# Patient Record
Sex: Male | Born: 1960 | Race: White | Hispanic: No | Marital: Single | Smoking: Former smoker
Health system: Southern US, Community
[De-identification: ages and names within clinical notes are randomized; demographics above are authoritative.]

## PROBLEM LIST (undated history)

## (undated) DIAGNOSIS — K219 Gastro-esophageal reflux disease without esophagitis: Secondary | ICD-10-CM

## (undated) DIAGNOSIS — E785 Hyperlipidemia, unspecified: Secondary | ICD-10-CM

## (undated) DIAGNOSIS — Z86010 Personal history of colonic polyps: Secondary | ICD-10-CM

## (undated) DIAGNOSIS — N529 Male erectile dysfunction, unspecified: Secondary | ICD-10-CM

## (undated) DIAGNOSIS — Z720 Tobacco use: Secondary | ICD-10-CM

## (undated) DIAGNOSIS — Z860101 Personal history of adenomatous and serrated colon polyps: Secondary | ICD-10-CM

## (undated) DIAGNOSIS — L0292 Furuncle, unspecified: Secondary | ICD-10-CM

## (undated) DIAGNOSIS — L0293 Carbuncle, unspecified: Secondary | ICD-10-CM

## (undated) DIAGNOSIS — E669 Obesity, unspecified: Secondary | ICD-10-CM

## (undated) HISTORY — DX: Furuncle, unspecified: L02.92

## (undated) HISTORY — DX: Male erectile dysfunction, unspecified: N52.9

## (undated) HISTORY — DX: Tobacco use: Z72.0

## (undated) HISTORY — DX: Hyperlipidemia, unspecified: E78.5

## (undated) HISTORY — PX: COLONOSCOPY: SHX174

## (undated) HISTORY — DX: Personal history of adenomatous and serrated colon polyps: Z86.0101

## (undated) HISTORY — DX: Personal history of colonic polyps: Z86.010

## (undated) HISTORY — DX: Gastro-esophageal reflux disease without esophagitis: K21.9

## (undated) HISTORY — PX: ELBOW FRACTURE SURGERY: SHX616

## (undated) HISTORY — DX: Carbuncle, unspecified: L02.93

## (undated) HISTORY — DX: Obesity, unspecified: E66.9

## (undated) HISTORY — PX: WISDOM TOOTH EXTRACTION: SHX21

---

## 2003-11-23 ENCOUNTER — Ambulatory Visit: Payer: Self-pay | Admitting: Family Medicine

## 2004-01-17 ENCOUNTER — Ambulatory Visit: Payer: Self-pay | Admitting: Family Medicine

## 2004-01-24 ENCOUNTER — Ambulatory Visit: Payer: Self-pay | Admitting: Family Medicine

## 2004-01-24 ENCOUNTER — Encounter: Admission: RE | Admit: 2004-01-24 | Discharge: 2004-01-24 | Payer: Self-pay | Admitting: Family Medicine

## 2004-01-31 ENCOUNTER — Ambulatory Visit: Payer: Self-pay | Admitting: Internal Medicine

## 2004-02-10 ENCOUNTER — Ambulatory Visit: Payer: Self-pay | Admitting: Internal Medicine

## 2004-06-30 ENCOUNTER — Ambulatory Visit: Payer: Self-pay | Admitting: Family Medicine

## 2004-07-01 ENCOUNTER — Emergency Department (HOSPITAL_COMMUNITY): Admission: EM | Admit: 2004-07-01 | Discharge: 2004-07-01 | Payer: Self-pay | Admitting: Emergency Medicine

## 2004-07-18 ENCOUNTER — Ambulatory Visit: Payer: Self-pay | Admitting: Family Medicine

## 2005-04-26 ENCOUNTER — Ambulatory Visit: Payer: Self-pay | Admitting: Family Medicine

## 2005-07-26 ENCOUNTER — Ambulatory Visit: Payer: Self-pay | Admitting: Family Medicine

## 2005-11-08 ENCOUNTER — Ambulatory Visit: Payer: Self-pay | Admitting: Family Medicine

## 2006-01-29 ENCOUNTER — Ambulatory Visit: Payer: Self-pay | Admitting: Family Medicine

## 2006-01-29 LAB — CONVERTED CEMR LAB
Cholesterol: 123 mg/dL (ref 0–200)
HDL: 26.6 mg/dL — ABNORMAL LOW (ref >39.0)
Total CHOL/HDL Ratio: 4.6
Triglycerides: 60 mg/dL (ref 0–149)

## 2006-09-25 DIAGNOSIS — L0293 Carbuncle, unspecified: Secondary | ICD-10-CM

## 2006-09-25 DIAGNOSIS — L0292 Furuncle, unspecified: Secondary | ICD-10-CM | POA: Insufficient documentation

## 2006-09-25 DIAGNOSIS — E785 Hyperlipidemia, unspecified: Secondary | ICD-10-CM | POA: Insufficient documentation

## 2006-09-25 DIAGNOSIS — Z720 Tobacco use: Secondary | ICD-10-CM | POA: Insufficient documentation

## 2006-09-25 DIAGNOSIS — E669 Obesity, unspecified: Secondary | ICD-10-CM | POA: Insufficient documentation

## 2006-09-25 DIAGNOSIS — F528 Other sexual dysfunction not due to a substance or known physiological condition: Secondary | ICD-10-CM | POA: Insufficient documentation

## 2006-10-14 DIAGNOSIS — L0231 Cutaneous abscess of buttock: Secondary | ICD-10-CM | POA: Insufficient documentation

## 2006-10-14 DIAGNOSIS — L03317 Cellulitis of buttock: Secondary | ICD-10-CM | POA: Insufficient documentation

## 2006-10-22 ENCOUNTER — Ambulatory Visit: Payer: Self-pay | Admitting: Family Medicine

## 2006-10-29 ENCOUNTER — Ambulatory Visit: Payer: Self-pay | Admitting: Family Medicine

## 2006-10-29 ENCOUNTER — Telehealth: Payer: Self-pay | Admitting: Family Medicine

## 2007-04-27 DIAGNOSIS — M26629 Arthralgia of temporomandibular joint, unspecified side: Secondary | ICD-10-CM | POA: Insufficient documentation

## 2007-04-29 ENCOUNTER — Ambulatory Visit: Payer: Self-pay | Admitting: Family Medicine

## 2007-06-17 DIAGNOSIS — L03119 Cellulitis of unspecified part of limb: Secondary | ICD-10-CM

## 2007-06-17 DIAGNOSIS — L02419 Cutaneous abscess of limb, unspecified: Secondary | ICD-10-CM | POA: Insufficient documentation

## 2007-06-18 ENCOUNTER — Ambulatory Visit: Payer: Self-pay | Admitting: Family Medicine

## 2007-06-23 ENCOUNTER — Ambulatory Visit: Payer: Self-pay | Admitting: Family Medicine

## 2007-06-23 ENCOUNTER — Encounter: Payer: Self-pay | Admitting: *Deleted

## 2007-06-26 ENCOUNTER — Ambulatory Visit: Payer: Self-pay | Admitting: Family Medicine

## 2007-07-08 ENCOUNTER — Ambulatory Visit: Payer: Self-pay | Admitting: Family Medicine

## 2007-07-08 LAB — CONVERTED CEMR LAB
ALT: 16 units/L (ref 0–53)
AST: 16 units/L (ref 0–37)
Alkaline Phosphatase: 57 units/L (ref 39–117)
Basophils Absolute: 0 10*3/uL (ref 0.0–0.1)
Basophils Relative: 0.1 % (ref 0.0–1.0)
Bilirubin, Direct: 0.1 mg/dL (ref 0.0–0.3)
CO2: 28 meq/L (ref 19–32)
Chloride: 105 meq/L (ref 96–112)
Eosinophils Absolute: 0.2 10*3/uL (ref 0.0–0.7)
GFR calc non Af Amer: 96 mL/min
HDL: 30.2 mg/dL — ABNORMAL LOW (ref >39.0)
LDL Cholesterol: 94 mg/dL (ref 0–99)
Lymphocytes Relative: 21.6 % (ref 12.0–46.0)
MCHC: 35 g/dL (ref 30.0–36.0)
MCV: 89.4 fL (ref 78.0–100.0)
Neutrophils Relative %: 68.8 % (ref 43.0–77.0)
Platelets: 247 10*3/uL (ref 150–400)
Potassium: 4.5 meq/L (ref 3.5–5.1)
Protein, U semiquant: NEGATIVE
RBC: 5.13 M/uL (ref 4.22–5.81)
Total Bilirubin: 0.7 mg/dL (ref 0.3–1.2)
Urobilinogen, UA: 0.2
VLDL: 15 mg/dL (ref 0–40)
WBC Urine, dipstick: NEGATIVE
WBC: 7.9 10*3/uL (ref 4.5–10.5)
pH: 5

## 2007-07-15 ENCOUNTER — Ambulatory Visit: Payer: Self-pay | Admitting: Family Medicine

## 2007-10-04 ENCOUNTER — Ambulatory Visit: Payer: Self-pay | Admitting: Family Medicine

## 2008-03-21 ENCOUNTER — Encounter: Payer: Self-pay | Admitting: Emergency Medicine

## 2009-01-12 ENCOUNTER — Encounter (INDEPENDENT_AMBULATORY_CARE_PROVIDER_SITE_OTHER): Payer: Self-pay | Admitting: *Deleted

## 2009-03-30 ENCOUNTER — Ambulatory Visit: Payer: Self-pay | Admitting: Family Medicine

## 2009-03-30 LAB — CONVERTED CEMR LAB
AST: 19 units/L (ref 0–37)
Alkaline Phosphatase: 53 units/L (ref 39–117)
Basophils Absolute: 0 10*3/uL (ref 0.0–0.1)
Basophils Relative: 0.6 % (ref 0.0–3.0)
Bilirubin, Direct: 0 mg/dL (ref 0.0–0.3)
Blood in Urine, dipstick: NEGATIVE
CO2: 29 meq/L (ref 19–32)
Calcium: 9.1 mg/dL (ref 8.4–10.5)
Creatinine, Ser: 0.9 mg/dL (ref 0.4–1.5)
Eosinophils Absolute: 0.2 10*3/uL (ref 0.0–0.7)
GFR calc non Af Amer: 95.41 mL/min (ref >60)
HDL: 36.3 mg/dL — ABNORMAL LOW (ref >39.00)
Ketones, urine, test strip: NEGATIVE
LDL Cholesterol: 99 mg/dL (ref 0–99)
Lymphocytes Relative: 22.5 % (ref 12.0–46.0)
MCHC: 32.7 g/dL (ref 30.0–36.0)
Monocytes Relative: 7.5 % (ref 3.0–12.0)
Neutrophils Relative %: 67.5 % (ref 43.0–77.0)
Nitrite: NEGATIVE
Protein, U semiquant: NEGATIVE
RBC: 4.98 M/uL (ref 4.22–5.81)
Sodium: 141 meq/L (ref 135–145)
Specific Gravity, Urine: 1.015
Total CHOL/HDL Ratio: 4
Total Protein: 6.6 g/dL (ref 6.0–8.3)
Triglycerides: 85 mg/dL (ref 0.0–149.0)
Urobilinogen, UA: 0.2
VLDL: 17 mg/dL (ref 0.0–40.0)
WBC Urine, dipstick: NEGATIVE

## 2009-04-19 ENCOUNTER — Ambulatory Visit: Payer: Self-pay | Admitting: Family Medicine

## 2009-06-20 ENCOUNTER — Ambulatory Visit: Payer: Self-pay | Admitting: Family Medicine

## 2009-06-22 ENCOUNTER — Ambulatory Visit: Payer: Self-pay | Admitting: Family Medicine

## 2009-07-25 ENCOUNTER — Ambulatory Visit: Payer: Self-pay | Admitting: Family Medicine

## 2009-07-25 DIAGNOSIS — M216X9 Other acquired deformities of unspecified foot: Secondary | ICD-10-CM | POA: Insufficient documentation

## 2009-09-08 ENCOUNTER — Encounter: Payer: Self-pay | Admitting: Family Medicine

## 2009-09-12 ENCOUNTER — Telehealth: Payer: Self-pay | Admitting: Internal Medicine

## 2009-09-21 ENCOUNTER — Encounter (INDEPENDENT_AMBULATORY_CARE_PROVIDER_SITE_OTHER): Payer: Self-pay | Admitting: *Deleted

## 2009-11-21 ENCOUNTER — Ambulatory Visit: Payer: Self-pay | Admitting: Family Medicine

## 2010-01-31 NOTE — Assessment & Plan Note (Signed)
Summary: flu shot/cjr  Nurse Visit   Allergies: No Known Drug Allergies  Orders Added: 1)  Admin 1st Vaccine [90471] 2)  Flu Vaccine 3yrs + [90658] Flu Vaccine Consent Questions     Do you have a history of severe allergic reactions to this vaccine? no    Any prior history of allergic reactions to egg and/or gelatin? no    Do you have a sensitivity to the preservative Thimersol? no    Do you have a past history of Guillan-Barre Syndrome? no    Do you currently have an acute febrile illness? no    Have you ever had a severe reaction to latex? no    Vaccine information given and explained to patient? yes    Are you currently pregnant? no    Lot Number:[id number]   Exp Date:07/01/2010   Site Given  Left Deltoid IM .lbflu 

## 2010-01-31 NOTE — Assessment & Plan Note (Signed)
Summary: CPX // RS   Vital Signs:  Patient profile:   50 year old male Height:      72.5 inches Weight:      237 pounds Temp:     98.1 degrees F oral BP sitting:   120 / 80  (left arm) Cuff size:   regular  Vitals Entered By: Kern Reap CMA Duncan Dull) (April 19, 2009 3:01 PM) CC: cpx   CC:  cpx.  History of Present Illness: Marc Brown is a 50 year old, divorced male, smoker, one pack per day and comes in today for general physical examination  We have discussed very strategies in the past to try to get him off nicotine.  His major concern is the history of depression with the medication.  I explained to him that this was extremely rare and that by starting out with a half a tablet daily for a prolonged period of time.  This may work.  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  He also drinks 40 ounces of caffeinated coffee daily.  His alcohol consumption is modest only twice per week.  He does walk his dog on a daily basis.    His routine eye care and dental care.  Tetanus 2007 seasonal flu 2010.  he's had two colonoscopies.  He has had colon polyps and is encouraged to go back every 5 years for follow-up colonoscopy  Allergies: No Known Drug Allergies  Past History:  Past medical, surgical, family and social histories (including risk factors) reviewed, and no changes noted (except as noted below).  Past Medical History: Reviewed history from 09/25/2006 and no changes required. Hyperlipidemia obese tobacco abuse erectile dysfunction recurrent boils  Past Surgical History: Reviewed history from 09/25/2006 and no changes required. elbow fx L  Family History: Reviewed history from 04/29/2007 and no changes required. father died at 77 of an MI.  He was underlying obesity smoker and schizophrenic mother early 13s has deafness secondary to whooping cough, polycystic kidneys two half brothers, one has MS, the other has  hyperlipidemia one sister, a half-sister, who had polio at age 41, and, is wheelchair confined  Social History: Reviewed history from 04/29/2007 and no changes required. Occupation:interpreter for the deaf Divorced Current Smoker Alcohol use-no Drug use-no Regular exercise-no  Review of Systems      See HPI  Physical Exam  General:  Well-developed,well-nourished,in no acute distress; alert,appropriate and cooperative throughout examination Head:  Normocephalic and atraumatic without obvious abnormalities. No apparent alopecia or balding. Eyes:  No corneal or conjunctival inflammation noted. EOMI. Perrla. Funduscopic exam benign, without hemorrhages, exudates or papilledema. Vision grossly normal. Ears:  External ear exam shows no significant lesions or deformities.  Otoscopic examination reveals clear canals, tympanic membranes are intact bilaterally without bulging, retraction, inflammation or discharge. Hearing is grossly normal bilaterally. Nose:  External nasal examination shows no deformity or inflammation. Nasal mucosa are pink and moist without lesions or exudates. Mouth:  Oral mucosa and oropharynx without lesions or exudates.  Teeth in good repair. Neck:  No deformities, masses, or tenderness noted. Chest Wall:  No deformities, masses, tenderness or gynecomastia noted. Breasts:  No masses or gynecomastia noted Lungs:  Normal respiratory effort, chest expands symmetrically. Lungs are clear to auscultation, no crackles or wheezes. Heart:  Normal rate and regular rhythm. S1 and S2 normal without gallop, murmur, click, rub or other extra sounds. Abdomen:  Bowel sounds positive,abdomen soft and non-tender without masses,  organomegaly or hernias noted. Rectal:  No external abnormalities noted. Normal sphincter tone. No rectal masses or tenderness. Genitalia:  Testes bilaterally descended without nodularity, tenderness or masses. No scrotal masses or lesions. No penis lesions or  urethral discharge. Prostate:  Prostate gland firm and smooth, no enlargement, nodularity, tenderness, mass, asymmetry or induration. Msk:  No deformity or scoliosis noted of thoracic or lumbar spine.   Pulses:  R and L carotid,radial,femoral,dorsalis pedis and posterior tibial pulses are full and equal bilaterally Extremities:  No clubbing, cyanosis, edema, or deformity noted with normal full range of motion of all joints.   Neurologic:  No cranial nerve deficits noted. Station and gait are normal. Plantar reflexes are down-going bilaterally. DTRs are symmetrical throughout. Sensory, motor and coordinative functions appear intact. Skin:  Intact without suspicious lesions or rashes Cervical Nodes:  No lymphadenopathy noted Axillary Nodes:  No palpable lymphadenopathy Inguinal Nodes:  No significant adenopathy Psych:  Cognition and judgment appear intact. Alert and cooperative with normal attention span and concentration. No apparent delusions, illusions, hallucinations   Impression & Recommendations:  Problem # 1:  PHYSICAL EXAMINATION (ICD-V70.0) Assessment Unchanged  Orders: Prescription Created Electronically [phone]) Tobacco use cessation intermediate 3-10 minutes (99406) EKG w/ Interpretation (93000)  Problem # 2:  TEMPOROMANDIBULAR JOINT PAIN (ICD-524.62) Assessment: Improved  Orders: Prescription Created Electronically [phone]) Tobacco use cessation intermediate 3-10 minutes (09811)  Problem # 3:  TOBACCO ABUSE (ICD-305.1) Assessment: Unchanged  His updated medication list for this problem includes:    Chantix Continuing Month Pak 1 Mg Tabs (Varenicline tartrate) ..... Uad  Orders: Prescription Created Electronically [phone]) Tobacco use cessation intermediate 3-10 minutes (29562)  Complete Medication List: 1)  Aspirin 81 Mg Tbec (Aspirin) .... Once daily 2)  Amitriptyline Hcl 25 Mg Tabs (Amitriptyline hcl) .Marland Kitchen.. 1 tab @ bedtime 3)  Chantix Continuing Month Pak 1 Mg Tabs  (Varenicline tartrate) .... Uad  Patient Instructions: 1)  tried the chantix program as we outlined .  If you have any problems, feel free to call me I'll be happy to try to work with you 2)  Please schedule a follow-up appointment in 1 year. 3)  It is important that you exercise regularly at least 20 minutes 5 times a week. If you develop chest pain, have severe difficulty breathing, or feel very tired , stop exercising immediately and seek medical attention. 4)  Schedule a colonoscopy/sigmoidoscopy to help detect colon cancer. 5)  Take an Aspirin every day. Prescriptions: CHANTIX CONTINUING MONTH PAK 1 MG TABS (VARENICLINE TARTRATE) UAD  #1 x 3   Entered and Authorized by:   Roderick Pee MD   Signed by:   Roderick Pee MD on 04/19/2009   Method used:   Print then Give to Patient   RxID:   [id number] AMITRIPTYLINE HCL 25 MG  TABS (AMITRIPTYLINE HCL) 1 tab @ bedtime  #100 Tablet x 3   Entered and Authorized by:   Roderick Pee MD   Signed by:   Roderick Pee MD on 04/19/2009   Method used:   [facility] to        [facility]  [address] (retail)       [address]       [place], [place]  [address]       Ph: [phone] or [phone]       Fax: [phone]   RxID:   [id number]

## 2010-01-31 NOTE — Consult Note (Signed)
Summary: [facility]  [facility]   Imported By: Maryln Gottron 09/15/2009 15:09:42  _____________________________________________________________________  External Attachment:    Type:   Image     Comment:   External Document

## 2010-01-31 NOTE — Assessment & Plan Note (Signed)
Summary: F/U ON LEG // RS   History of Present Illness: Marc Brown is a 50 year old male, who comes back today for follow-up of a cellulitis of his right lateral lower extremity.  We saw him two days ago with cellulitis.  No history of trauma cuts et Karie Soda.  This is the third episode he sat in the same extremity.  Review of systems otherwise negative.  We start him on Augmentin one b.i.d. he states is markedly improved.  The redness is starting to diminish.  No side effects from medication.  Warned about Clostridium difficile colitis for atb   Allergies: No Known Drug Allergies  Past History:  Past medical, surgical, family and social histories (including risk factors) reviewed, and no changes noted (except as noted below).  Past Medical History: Reviewed history from 09/25/2006 and no changes required. Hyperlipidemia obese tobacco abuse erectile dysfunction recurrent boils  Past Surgical History: Reviewed history from 09/25/2006 and no changes required. elbow fx L  Family History: Reviewed history from 04/29/2007 and no changes required. father died at 74 of an MI.  He was underlying obesity smoker and schizophrenic mother early 64s has deafness secondary to whooping cough, polycystic kidneys two half brothers, one has MS, the other has hyperlipidemia one sister, a half-sister, who had polio at age 35, and, is wheelchair confined  Social History: Reviewed history from 04/19/2009 and no changes required. Occupation:interpreter for the deaf Divorced Current Smoker Alcohol use-no Drug use-no Regular exercise-no  Review of Systems      See HPI  Physical Exam  General:  Well-developed,well-nourished,in no acute distress; alert,appropriate and cooperative throughout examination Skin:  resolving cellulitis.   Impression & Recommendations:  Problem # 1:  CELLULITIS, LEG, RIGHT (ICD-682.6) Assessment Improved  His updated medication list for this problem includes:  Augmentin 875-125 Mg Tabs (Amoxicillin-pot clavulanate) .Marland Kitchen... Take 1 tablet by mouth two times a day  Complete Medication List: 1)  Aspirin 81 Mg Tbec (Aspirin) .... Once daily 2)  Amitriptyline Hcl 25 Mg Tabs (Amitriptyline hcl) .Marland Kitchen.. 1 tab @ bedtime 3)  Chantix Continuing Month Pak 1 Mg Tabs (Varenicline tartrate) .... Uad 4)  Augmentin 875-125 Mg Tabs (Amoxicillin-pot clavulanate) .... Take 1 tablet by mouth two times a day  Patient Instructions: 1)  continue the medication as outlined.  Return p.r.n.

## 2010-01-31 NOTE — Letter (Signed)
Summary: Colonoscopy Letter  [facility]  [address] [place], [place] [address]   Phone: [phone]  Fax: [phone]      January 12, 2009 MRN: [id number]   TYRIC RODEHEAVER [address] [place], [place]  [address]   Dear Mr. Boyington,   According to your medical record, it is time for you to schedule a Colonoscopy. The American Cancer Society recommends this procedure as a method to detect early colon cancer. Patients with a family history of colon cancer, or a personal history of colon polyps or inflammatory bowel disease are at increased risk.  This letter has beeen generated based on the recommendations made at the time of your procedure. If you feel that in your particular situation this may no longer apply, please contact our office.  Please call our office at [phone] to schedule this appointment or to update your records at your earliest convenience.  Thank you for cooperating with [place] to provide you with the very best care possible.   Sincerely,  Iva Boop, M.D.  [facility] [phone]

## 2010-01-31 NOTE — Progress Notes (Signed)
Summary: Schedule Colonoscopy  Phone Note Outgoing Call Call back at [facility] Phone [phone]   Call placed by: Harlow Mares CMA Duncan Dull),  September 12, 2009 11:51 AM Call placed to: Patient Summary of Call: No Answer, patient is due for a colonoscopy Initial call taken by: Harlow Mares CMA Duncan Dull),  September 12, 2009 11:56 AM  Follow-up for Phone Call        COLON 10/11 Follow-up by: Harlow Mares CMA (AAMA),  September 21, 2009 10:40 AM

## 2010-01-31 NOTE — Assessment & Plan Note (Signed)
Summary: cellulitis//ccm   Vital Signs:  Patient profile:   50 year old male Weight:      239 pounds Temp:     98.2 degrees F oral BP sitting:   110 / 80  (left arm) Cuff size:   regular  Vitals Entered By: Kern Reap CMA Duncan Dull) (June 20, 2009 9:26 AM) CC: cellutitis right leg   CC:  cellutitis right leg.  History of Present Illness: Doreatha Martin is a 50 year old male, who comes in today for evaluation of the third episode of cellulitis of his right lower extremity.  He had the same problem in 2009.  He did not respond to Keflex.  We had to give him a gram of Rocephin IM, followed by Cipro.  No history of trauma.  It started yesterday with redness and swelling of his right lateral lower extremity.  Allergies: No Known Drug Allergies  Physical Exam  General:  Well-developed,well-nourished,in no acute distress; alert,appropriate and cooperative throughout examination Msk:  this redness and swelling of the right lateral lower extremity.  Approximately 4 x 8 inches in diameter   Impression & Recommendations:  Problem # 1:  CELLULITIS, LEG, RIGHT (ICD-682.6) Assessment Deteriorated  His updated medication list for this problem includes:    Augmentin 875-125 Mg Tabs (Amoxicillin-pot clavulanate) .Marland Kitchen... Take 1 tablet by mouth two times a day  Orders: Prescription Created Electronically [phone])  Complete Medication List: 1)  Aspirin 81 Mg Tbec (Aspirin) .... Once daily 2)  Amitriptyline Hcl 25 Mg Tabs (Amitriptyline hcl) .Marland Kitchen.. 1 tab @ bedtime 3)  Chantix Continuing Month Pak 1 Mg Tabs (Varenicline tartrate) .... Uad 4)  Augmentin 875-125 Mg Tabs (Amoxicillin-pot clavulanate) .... Take 1 tablet by mouth two times a day  Patient Instructions: 1)  begin Augmentin one twice daily.  Elevation, heating pad etc.  Return Wednesday morning at 815, sooner if any problems Prescriptions: AUGMENTIN 875-125 MG TABS (AMOXICILLIN-POT CLAVULANATE) Take 1 tablet by mouth two times a day  #20 x 1   Entered and Authorized by:   Roderick Pee MD   Signed by:   Roderick Pee MD on 06/20/2009   Method used:   Electronically to        [facility] ([facility]ail)       [address]       [place], [place]  [address]       Ph: [phone]       Fax: [phone]   RxID:   [id number]

## 2010-01-31 NOTE — Assessment & Plan Note (Signed)
Summary: fu on meds/njr   Vital Signs:  Patient profile:   50 year old male Height:      74 inches Weight:      234 pounds BMI:     30.15 Temp:     98.1 degrees F oral BP sitting:   142 / 98  (left arm) Cuff size:   regular  Vitals Entered By: Kern Reap CMA Duncan Dull) (March 30, 2009 9:27 AM) CC: follow-up visit   CC:  follow-up visit.  History of Present Illness: Marc Brown is a 50 year old male, nonsmoker, who comes in today for reevaluation of TMJ syndrome.  We saw him over a year ago for his physical exam.  At that time.  He had TMJ syndrome.  That was April 2009.  We put him on Elavil 25 mg nightly a soft diet and Vicodin p.r.n. for severe pain.  He never filled the Vicodin.  He did take the Motrin and the Elavil.  With the Elavil.  His pain is gone.  Allergies: No Known Drug Allergies  Past History:  Past medical, surgical, family and social histories (including risk factors) reviewed, and no changes noted (except as noted below).  Past Medical History: Reviewed history from 09/25/2006 and no changes required. Hyperlipidemia obese tobacco abuse erectile dysfunction recurrent boils  Past Surgical History: Reviewed history from 09/25/2006 and no changes required. elbow fx L  Family History: Reviewed history from 04/29/2007 and no changes required. father died at 44 of an MI.  He was underlying obesity smoker and schizophrenic mother early 46s has deafness secondary to whooping cough, polycystic kidneys two half brothers, one has MS, the other has hyperlipidemia one sister, a half-sister, who had polio at age 23, and, is wheelchair confined  Social History: Reviewed history from 04/29/2007 and no changes required. Occupation: Divorced Current Smoker Alcohol use-no Drug use-no Regular exercise-no  Review of Systems      See HPI  Physical Exam  General:  Well-developed,well-nourished,in no acute distress; alert,appropriate and cooperative throughout  examination   Impression & Recommendations:  Problem # 1:  TEMPOROMANDIBULAR JOINT PAIN (ICD-524.62) Assessment Improved  Complete Medication List: 1)  Aspirin 81 Mg Tbec (Aspirin) .... Once daily 2)  Amitriptyline Hcl 25 Mg Tabs (Amitriptyline hcl) .Marland Kitchen.. 1 tab @ bedtime  Other Orders: Venipuncture (89381) Prescription Created Electronically [phone]) UA Dipstick w/o Micro (automated)  (81003) TLB-Lipid Panel (80061-LIPID) TLB-BMP (Basic Metabolic Panel-BMET) (80048-METABOL) TLB-CBC Platelet - w/Differential (85025-CBCD) TLB-Hepatic/Liver Function Pnl (80076-HEPATIC) TLB-TSH (Thyroid Stimulating Hormone) (84443-TSH) TLB-PSA (Prostate Specific Antigen) (84153-PSA)  Patient Instructions: 1)  continue the Elavil 25 mg nightly 2)  We will do all your physical lab work today. 3)  Set up a 30 minute appointment sometime in the next two to 4 weeks for your annual exam Prescriptions: AMITRIPTYLINE HCL 25 MG  TABS (AMITRIPTYLINE HCL) 1 tab @ bedtime  #100 Tablet x 3   Entered and Authorized by:   Roderick Pee MD   Signed by:   Roderick Pee MD on 03/30/2009   Method used:   Electronically to        [facility])       [address]       [place], [place]  [address]       Ph: [phone]       Fax: [phone]   RxID:   [id number]   Laboratory Results   Urine Tests  Date/Time Recieved: March 30, 2009  10:40 AM  Date/Time Reported: March 30, 2009 10:40 AM    Routine Urinalysis   Color: yellow Appearance: Clear Glucose: negative   (Normal Range: Negative) Bilirubin: negative   (Normal Range: Negative) Ketone: negative   (Normal Range: Negative) Spec. Gravity: 1.015   (Normal Range: 1.003-1.035) Blood: negative   (Normal Range: Negative) pH: 7.5   (Normal Range: 5.0-8.0) Protein: negative   (Normal Range: Negative) Urobilinogen: 0.2   (Normal Range: 0-1) Nitrite: negative   (Normal Range: Negative) Leukocyte Esterace:  negative   (Normal Range: Negative)    Comments: Wynona Canes, CMA  March 30, 2009 10:40 AM

## 2010-01-31 NOTE — Assessment & Plan Note (Signed)
Summary: ?pinched nerve in knee/can not flex foot upward/slackness in ...   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 No   History of Present Illness: Patient in today for evaluation of some neurologic symptoms in his RLE. 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 Sat to Weds, he was on his knees for 4-5 hours daily with pads on pounding in bricks. Weds night he went to the movies and had trouble sitting comfortably but denies ever having any pain. Thursday am he woke up with some numbness from knee down tibial plateau into toes 3,4,5 and foot drop. He noticed it most when he was taking his daily 4 mile walk with his dog. His foot just would not lift, since then he has compensated by walking differently but he still cannot flex or extend his foot normally. He has been taking 600mg  of Ibuprofen every 4 hours trying to improve his symptoms but has only had some minimal improvement. He had a mild sense of numbness along his lateral right thigh and that is gone. The numbness now starts at mid anterior tibial plateau as opposed to at knee, still includes 3 toes. He denies any back pain or incontinence. Has a long history of recurrent mild CTS and has a little numbness in his 4th and 5th right hand fingers now but finds this tolerable. No recent illness, f,c, CP. Did have one fall when he was getting  dressed the other day and says he just bruised his tailbone neurologic concerns not worse since this episode  Current Medications (verified): 1)  Aspirin 81 Mg  Tbec (Aspirin) .... Once Daily 2)  Amitriptyline Hcl 25 Mg  Tabs (Amitriptyline Hcl) .Marland Kitchen.. 1 Tab @ Bedtime 3)  Chantix Continuing Month Pak 1 Mg Tabs (Varenicline Tartrate) .... Uad  Allergies (verified): No Known Drug Allergies  Past History:  Past medical history reviewed for relevance to current acute and chronic problems. Social history (including risk factors) reviewed for relevance to current acute and chronic problems.  Past Medical History: Reviewed history from 09/25/2006 and no changes required. Hyperlipidemia obese tobacco abuse erectile dysfunction recurrent boils  Social History: Reviewed history from 04/19/2009 and no changes required. Occupation:interpreter for the deaf Divorced Current Smoker Alcohol use-no Drug use-no Regular exercise-no  Review of Systems      See HPI  Physical Exam  General:  Well-developed,well-nourished,in no acute distress; alert,appropriate and cooperative throughout examination Head:  Normocephalic and atraumatic without obvious abnormalities. No apparent alopecia or balding. Mouth:  Oral mucosa and oropharynx without lesions or exudates.  Teeth in good repair. Neck:  No deformities, masses, or tenderness noted. Lungs:  Normal respiratory effort, chest expands  symmetrically. Lungs are clear to auscultation, no crackles or wheezes. Heart:  Normal rate and regular rhythm. S1 and S2 normal without gallop, murmur, click, rub or other extra sounds. Abdomen:  Bowel sounds positive,abdomen soft and non-tender without masses, organomegaly or hernias noted. Msk:  No deformity or scoliosis noted of thoracic or lumbar spine.   Pulses:  R and Ldorsalis pedis and posterior tibial pulses are full and equal bilaterally Extremities:  No clubbing, cyanosis, edema, or deformity noted. Unable  to dorsiflex or plantarflex at ankle in right foot. left foot from. Can invert slightly but eversion is decreased in right foot as well. Able to move all toes at will. DTRs normal and symmetric patellar    Impression & Recommendations:  Problem # 1:  FOOT DROP, RIGHT (ICD-736.79)  Orders: Neurology Referral (Neuro): due to severity of symptoms will refer now in case symptoms do not improve. Started on Prednisone 40mg  once daily x 5 days. Stop Ibuprofen and then when Prednisone is done start Naproxen bid  Problem # 2:  TOBACCO ABUSE (ICD-305.1)  His updated medication list for this problem includes:    Chantix Continuing Month Pak 1 Mg Tabs (Varenicline tartrate) ..... Uad Encouraged to try smoking cessation again as it will help healing process.  Complete Medication List: 1)  Aspirin 81 Mg Tbec (Aspirin) .... Once daily 2)  Amitriptyline Hcl 25 Mg Tabs (Amitriptyline hcl) .Marland Kitchen.. 1 tab @ bedtime 3)  Chantix Continuing Month Pak 1 Mg Tabs (Varenicline tartrate) .... Uad 4)  Prednisone 20 Mg Tabs (Prednisone) .... 2 tabs by mouth once daily x 5d  Patient Instructions: 1)  Apply ice, Aspercreme to right carpal tunnel two times a day if symptoms persist.  2)  Stop Ibuprofen, use Prednisone for 5 days then try Naproxen/Aleve as needed for pain with food Prescriptions: PREDNISONE 20 MG TABS (PREDNISONE) 2 tabs by mouth once daily x 5d  #10 x 0   Entered and Authorized by:   Danise Edge MD   Signed by:   Danise Edge MD on 07/25/2009   Method used:   Electronically to        [facility]       [address]       [place], [place]  [address]       Ph: [phone]       Fax: [phone]   RxID:   [id number]

## 2010-01-31 NOTE — Letter (Signed)
Summary: Pre Visit Letter Revised  [facility]  [address] [place], [place] [address]   Phone: [phone]  Fax: [phone]        09/21/2009 MRN: [id number] Marc Brown [address] [place], [place]  [address]                                Procedure Date:  10/11/2009  Welcome to the Gastroenterology Division at [facility].    You are scheduled to see a nurse for your pre-procedure visit on 10/07/2009 at 2:00pm on the 3rd floor at [facility], [address]  We ask that you try to arrive at our office 15 minutes prior to your appointment time to allow for check-in.  Please take a minute to review the attached form.  If you answer "Yes" to one or more of the questions on the first page, we ask that you call the person listed at your earliest opportunity.  If you answer "No" to all of the questions, please complete the rest of the form and bring it to your appointment.    Your nurse visit will consist of discussing your medical and surgical history, your immediate family medical history, and your medications.   If you are unable to list all of your medications on the form, please bring the medication bottles to your appointment and we will list them.  We will need to be aware of both prescribed and over the counter drugs.  We will need to know exact dosage information as well.    Please be prepared to read and sign documents such as consent forms, a financial agreement, and acknowledgement forms.  If necessary, and with your consent, a friend or relative is welcome to sit-in on the nurse visit with you.  Please bring your insurance card so that we may make a copy of it.  If your insurance requires a referral to see a specialist, please bring your referral form from your primary care physician.  No co-pay is required for this nurse visit.     If you cannot keep your appointment, please call [phone] to cancel or reschedule prior to your appointment  date.  This allows [place] the opportunity to schedule an appointment for another patient in need of care.    Thank you for choosing [facility] for your medical needs.  We appreciate the opportunity to care for you.  Please visit [place] at our website  to learn more about our practice.  Sincerely, The Gastroenterology Division

## 2010-04-13 LAB — MAGNESIUM: Magnesium: 2.3 mg/dL (ref 1.5–2.5)

## 2010-04-13 LAB — URINALYSIS, ROUTINE W REFLEX MICROSCOPIC
Hgb urine dipstick: NEGATIVE
Protein, ur: NEGATIVE mg/dL
Urobilinogen, UA: 0.2 mg/dL (ref 0.0–1.0)

## 2010-04-13 LAB — BASIC METABOLIC PANEL
BUN: 9 mg/dL (ref 6–23)
Calcium: 8.8 mg/dL (ref 8.4–10.5)
Creatinine, Ser: 1.1 mg/dL (ref 0.4–1.5)
GFR calc non Af Amer: 51 mL/min — ABNORMAL LOW (ref >60)
Glucose, Bld: 112 mg/dL — ABNORMAL HIGH (ref 70–99)

## 2010-04-13 LAB — DIFFERENTIAL
Basophils Absolute: 0 10*3/uL (ref 0.0–0.1)
Eosinophils Relative: 2 % (ref 0–5)
Lymphocytes Relative: 12 % (ref 12–46)
Neutro Abs: 11.7 10*3/uL — ABNORMAL HIGH (ref 1.7–7.7)
Neutrophils Relative %: 83 % — ABNORMAL HIGH (ref 43–77)

## 2010-04-13 LAB — RAPID URINE DRUG SCREEN, HOSP PERFORMED
Amphetamines: POSITIVE — AB
Barbiturates: NOT DETECTED
Benzodiazepines: POSITIVE — AB
Opiates: NOT DETECTED

## 2010-04-13 LAB — CBC
Platelets: 200 10*3/uL (ref 150–400)
RDW: 13.1 % (ref 11.5–15.5)

## 2010-05-11 ENCOUNTER — Other Ambulatory Visit: Payer: Self-pay | Admitting: Family Medicine

## 2010-08-26 ENCOUNTER — Other Ambulatory Visit: Payer: Self-pay | Admitting: Family Medicine

## 2010-10-17 ENCOUNTER — Other Ambulatory Visit: Payer: Self-pay | Admitting: Family Medicine

## 2010-12-12 ENCOUNTER — Ambulatory Visit (INDEPENDENT_AMBULATORY_CARE_PROVIDER_SITE_OTHER): Payer: BC Managed Care – PPO | Admitting: Family Medicine

## 2010-12-12 ENCOUNTER — Encounter: Payer: Self-pay | Admitting: Family Medicine

## 2010-12-12 DIAGNOSIS — E669 Obesity, unspecified: Secondary | ICD-10-CM

## 2010-12-12 DIAGNOSIS — F17200 Nicotine dependence, unspecified, uncomplicated: Secondary | ICD-10-CM

## 2010-12-12 DIAGNOSIS — E785 Hyperlipidemia, unspecified: Secondary | ICD-10-CM

## 2010-12-12 DIAGNOSIS — G4700 Insomnia, unspecified: Secondary | ICD-10-CM

## 2010-12-12 LAB — POCT URINALYSIS DIPSTICK
Blood, UA: NEGATIVE
Protein, UA: NEGATIVE
Spec Grav, UA: 1.025
Urobilinogen, UA: 0.2

## 2010-12-12 MED ORDER — AMITRIPTYLINE HCL 25 MG PO TABS: 25.0000 mg | ORAL_TABLET | Freq: Every day | ORAL | Status: DC

## 2010-12-12 MED ORDER — VARENICLINE TARTRATE 1 MG PO TABS: 1.0000 mg | ORAL_TABLET | Freq: Two times a day (BID) | ORAL | Status: DC

## 2010-12-12 NOTE — Progress Notes (Signed)
  Subjective:    Patient ID: Marc Brown, male    DOB: 1960/03/26, 50 y.o.   MRN: [id number]  HPI Marc Brown is a 50 year old male, single, nonsmoker, who comes in today for annual physical examination because of a history of sleep dysfunction, tobacco abuse, weight gain, question hypertension.  We put him on a smoking cessation program with chantix however, he did never got the prescription filled and continues to smoke a pack and half of cigarettes a day.  He's been going to the dentist and they have been monitoring his blood pressure because of slightly elevated BP here 130/90.  He's noticed some weight gain.  He septa 267 pounds.  He wonders if he might be diabetic.  Tetanus booster 2007 seasonal flu shot 2012   Review of Systems  Constitutional: Positive for unexpected weight change.       Objective:   Physical Exam  Constitutional: He is oriented to person, place, and time. He appears well-developed and well-nourished.  HENT:  Head: Normocephalic and atraumatic.  Right Ear: External ear normal.  Left Ear: External ear normal.  Nose: Nose normal.  Mouth/Throat: Oropharynx is clear and moist.  Eyes: Conjunctivae and EOM are normal. Pupils are equal, round, and reactive to light.  Neck: Normal range of motion. Neck supple. No JVD present. No tracheal deviation present. No thyromegaly present.  Cardiovascular: Normal rate, regular rhythm, normal heart sounds and intact distal pulses.  Exam reveals no gallop and no friction rub.   No murmur heard. Pulmonary/Chest: Effort normal and breath sounds normal. No stridor. No respiratory distress. He has no wheezes. He has no rales. He exhibits no tenderness.  Abdominal: Soft. Bowel sounds are normal. He exhibits no distension and no mass. There is no tenderness. There is no rebound and no guarding.  Genitourinary: Rectum normal, prostate normal and penis normal. Guaiac negative stool. No penile tenderness.  Musculoskeletal: Normal range of  motion. He exhibits no edema and no tenderness.  Lymphadenopathy:    He has no cervical adenopathy.  Neurological: He is alert and oriented to person, place, and time. He has normal reflexes. No cranial nerve deficit. He exhibits normal muscle tone.  Skin: Skin is warm and dry. No rash noted. No erythema. No pallor.       Total body skin exam normal.  He states his mother had a melanoma.  He also sees his dermatologist yearly for a skin check  Psychiatric: He has a normal mood and affect. His behavior is normal. Judgment and thought content normal.          Assessment & Plan:  Healthy male.  Tobacco abuse, start a smoking cessation program.  Follow-up in 6 weeks.  Elevated blood pressure check.  BP q.a.m. Follow-up in 6 weeks with the data and the device.  Sleep dysfunction.  Continue Elavil 25 mg nightly

## 2010-12-12 NOTE — Patient Instructions (Signed)
Check a blood pressure every morning, and return in 6 weeks with the data and the device.  Remember to take an aspirin tablet before you fly  Begin the chantix one half tablet daily in the morning.  Begin tapering cigarettes by 5 per week.  Also, follow up with this problem in 6 weeks.  Four your overall health to begin a walking program 30 minutes daily to help reduce her weight.  I will call you in a couple days with your lab work.  In [place] and Say Hello to the [facility]  for Me

## 2010-12-13 LAB — LIPID PANEL
Cholesterol: 195 mg/dL (ref 0–200)
LDL Cholesterol: 124 mg/dL — ABNORMAL HIGH (ref 0–99)

## 2010-12-13 LAB — CBC WITH DIFFERENTIAL/PLATELET
Basophils Relative: 0.5 % (ref 0.0–3.0)
Eosinophils Absolute: 0.2 10*3/uL (ref 0.0–0.7)
HCT: 45.6 % (ref 39.0–52.0)
Hemoglobin: 15.6 g/dL (ref 13.0–17.0)
Lymphs Abs: 2.6 10*3/uL (ref 0.7–4.0)
MCHC: 34.2 g/dL (ref 30.0–36.0)
MCV: 90.4 fl (ref 78.0–100.0)
Monocytes Absolute: 0.7 10*3/uL (ref 0.1–1.0)
Neutro Abs: 4.7 10*3/uL (ref 1.4–7.7)
Neutrophils Relative %: 56.9 % (ref 43.0–77.0)
RBC: 5.04 Mil/uL (ref 4.22–5.81)

## 2010-12-13 LAB — BASIC METABOLIC PANEL
CO2: 27 mEq/L (ref 19–32)
Calcium: 8.8 mg/dL (ref 8.4–10.5)
Chloride: 105 mEq/L (ref 96–112)
Creatinine, Ser: 0.8 mg/dL (ref 0.4–1.5)
Glucose, Bld: 87 mg/dL (ref 70–99)

## 2010-12-13 LAB — HEPATIC FUNCTION PANEL
Alkaline Phosphatase: 60 U/L (ref 39–117)
Bilirubin, Direct: 0.1 mg/dL (ref 0.0–0.3)
Total Protein: 6.5 g/dL (ref 6.0–8.3)

## 2010-12-15 ENCOUNTER — Ambulatory Visit: Payer: Self-pay | Admitting: Family Medicine

## 2010-12-19 NOTE — Progress Notes (Signed)
Quick Note:  Pt aware ______ 

## 2011-01-23 ENCOUNTER — Ambulatory Visit (INDEPENDENT_AMBULATORY_CARE_PROVIDER_SITE_OTHER): Payer: BC Managed Care – PPO | Admitting: Family Medicine

## 2011-01-23 ENCOUNTER — Encounter: Payer: Self-pay | Admitting: Family Medicine

## 2011-01-23 VITALS — BP 140/90 | Temp 98.2°F | Wt 272.0 lb

## 2011-01-23 DIAGNOSIS — F17200 Nicotine dependence, unspecified, uncomplicated: Secondary | ICD-10-CM

## 2011-01-23 MED ORDER — VARENICLINE TARTRATE 1 MG PO TABS: ORAL_TABLET | ORAL | Status: DC

## 2011-01-23 NOTE — Progress Notes (Signed)
  Subjective:    Patient ID: Marc Brown, male    DOB: 12/19/1960, 51 y.o.   MRN: [id number]  HPI [name] is a 51 year old male, who comes in today for follow-up of tobacco abuse.  He currently smokes between 20 and 30 cigarettes per day.  We gave him a prescription for D. chantix however, he did get it filled yet presents to expensive!!!!!!!!!!!!??????????  We again outlined a smoking cessation program.  I again gave him another prescription for the chantix.  Also BP today 140/90   Review of Systems    General on cardiovascular review of systems otherwise negative Objective:   Physical Exam Well-developed well-nourished, male smells of tobacco       Assessment & Plan:  Tobacco abuse.  Plan start chantix program today.  Follow-up in 4 weeks

## 2011-01-23 NOTE — Patient Instructions (Signed)
Start the chantix program today by taking a half a tablet daily in the morning.  Taper as we discussed.  Follow-up office visit 4 weeks after starting the chantix

## 2011-11-06 ENCOUNTER — Ambulatory Visit (INDEPENDENT_AMBULATORY_CARE_PROVIDER_SITE_OTHER): Payer: BC Managed Care – PPO

## 2011-11-06 DIAGNOSIS — Z23 Encounter for immunization: Secondary | ICD-10-CM

## 2011-11-15 ENCOUNTER — Encounter: Payer: Self-pay | Admitting: Family Medicine

## 2011-11-15 ENCOUNTER — Ambulatory Visit (INDEPENDENT_AMBULATORY_CARE_PROVIDER_SITE_OTHER): Payer: BC Managed Care – PPO | Admitting: Family Medicine

## 2011-11-15 VITALS — BP 110/80 | Temp 98.4°F | Wt 276.0 lb

## 2011-11-15 DIAGNOSIS — L0293 Carbuncle, unspecified: Secondary | ICD-10-CM

## 2011-11-15 DIAGNOSIS — F17200 Nicotine dependence, unspecified, uncomplicated: Secondary | ICD-10-CM

## 2011-11-15 DIAGNOSIS — L0292 Furuncle, unspecified: Secondary | ICD-10-CM

## 2011-11-15 MED ORDER — DOXYCYCLINE HYCLATE 100 MG PO TABS: 100.0000 mg | ORAL_TABLET | Freq: Two times a day (BID) | ORAL | Status: DC

## 2011-11-15 NOTE — Patient Instructions (Addendum)
Septra and doxycycline, one of each twice daily   we will call you the report next week to determine further therapy  Start the Chantix one half tab Monday Wednesday Friday ,,,,,,,,,,

## 2011-11-15 NOTE — Progress Notes (Signed)
  Subjective:    Patient ID: Marc Brown, male    DOB: 1960/08/25, 51 y.o.   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 He's been treating the boil symptomatically with antibiotic ointment and a skin cleanser. He's been pretty much symptom free for couple years until last Sunday he noticed a boil on the right side of his ankle dorsum. It hasn't gotten any bigger since Sunday no fever chills no history of trauma  He is a smoker and I gave him the Chantix program. He's purchased the medication however he has not started take it because he is concerned it may cause mental illness.   Review of Systems Gen. dermatologic and psychiatric review of systems otherwise negative    Objective:   Physical Exam Well-developed well nourished male who smells of tobacco who comes in today for evaluation as above. Examination the foot shows a small red lesion the top was removed it bled that no pus dime size culture taken       Assessment & Plan:  Micheline Maze right ankle plan culture start on doxycycline and Septra  Tobacco abuse start the Chantix program by taking a half a tablet Monday Wednesday Friday for one month

## 2011-11-18 LAB — WOUND CULTURE
Gram Stain: NONE SEEN
Organism ID, Bacteria: NO GROWTH

## 2011-12-29 ENCOUNTER — Other Ambulatory Visit: Payer: Self-pay | Admitting: Family Medicine

## 2012-05-23 ENCOUNTER — Ambulatory Visit (INDEPENDENT_AMBULATORY_CARE_PROVIDER_SITE_OTHER): Payer: BC Managed Care – PPO | Admitting: Family Medicine

## 2012-05-23 VITALS — BP 118/82 | Temp 98.1°F | Wt 279.0 lb

## 2012-05-23 DIAGNOSIS — Z720 Tobacco use: Secondary | ICD-10-CM

## 2012-05-23 DIAGNOSIS — J209 Acute bronchitis, unspecified: Secondary | ICD-10-CM

## 2012-05-23 DIAGNOSIS — F17200 Nicotine dependence, unspecified, uncomplicated: Secondary | ICD-10-CM

## 2012-05-23 MED ORDER — AZITHROMYCIN 250 MG PO TABS: ORAL_TABLET | ORAL | Status: DC

## 2012-05-23 MED ORDER — FLUTICASONE PROPIONATE HFA 44 MCG/ACT IN AERO: 1.0000 | INHALATION_SPRAY | Freq: Two times a day (BID) | RESPIRATORY_TRACT | Status: DC

## 2012-05-23 NOTE — Progress Notes (Signed)
Chief Complaint  Patient presents with  . Bronchitis    since last Saturday; taking Mucinex; fever 101 intermit now low grade fever;dry cough     HPI:  Acute visit for cough: -started 6 days -symptoms: drainage in throat, cough, nasal congestion, low grade fevers, wheezing -denies: SOB, fevers for the last few days, NVD -sick contact: none -has tried musinex -denies hx lung disease, smoker  ROS: See pertinent positives and negatives per HPI.  Past Medical History  Diagnosis Date  . Hyperlipidemia   . Obese   . Tobacco abuse   . ED (erectile dysfunction)   . Recurrent boils     Family History  Problem Relation Age of Onset  . Heart disease Father   . Obesity Father   . Schizophrenia Father   . Kidney disease Mother   . Hearing loss Mother     due to whooping cough  . Multiple sclerosis Brother   . Hyperlipidemia Brother     History   Social History  . Marital Status: Single    Spouse Name: N/A    Number of Children: N/A  . Years of Education: N/A   Social History Main Topics  . Smoking status: Current Every Day Smoker  . Smokeless tobacco: Not on file  . Alcohol Use: No  . Drug Use: No  . Sexually Active:    Other Topics Concern  . Not on file   Social History Narrative  . No narrative on file    Current outpatient prescriptions:amitriptyline (ELAVIL) 25 MG tablet, TAKE  ONE TABLET BY MOUTH NIGHTLY AT BEDTIME, Disp: 90 tablet, Rfl: 2;  aspirin 81 MG tablet, Take 81 mg by mouth daily.  , Disp: , Rfl: ;  mupirocin ointment (BACTROBAN) 2 %, Apply topically 3 (three) times daily., Disp: , Rfl: ;  azithromycin (ZITHROMAX) 250 MG tablet, 2 tabs on the first day, then 1 tab daily for 4 days, Disp: 6 each, Rfl: 0 doxycycline (VIBRA-TABS) 100 MG tablet, Take 1 tablet (100 mg total) by mouth 2 (two) times daily., Disp: 50 tablet, Rfl: 2;  fluticasone (FLOVENT HFA) 44 MCG/ACT inhaler, Inhale 1 puff into the lungs 2 (two) times daily., Disp: 1 Inhaler, Rfl: 12;   varenicline (CHANTIX) 1 MG tablet, One half tablet q.a.m., Disp: 30 tablet, Rfl: 4  EXAM:  Filed Vitals:   05/23/12 1015  BP: 118/82  Temp: 98.1 F (36.7 C)    Body mass index is 36.82 kg/(m^2).  GENERAL: vitals reviewed and listed above, alert, oriented, appears well hydrated and in no acute distress  HEENT: atraumatic, conjunttiva clear, no obvious abnormalities on inspection of external nose and ears  NECK: no obvious masses on inspection  LUNGS: diffuse scattered exp wheezing  CV: HRRR, no peripheral edema  MS: moves all extremities without noticeable abnormality  PSYCH: pleasant and cooperative, no obvious depression or anxiety  ASSESSMENT AND PLAN:  Discussed the following assessment and plan:  Acute bronchitis - Plan: azithromycin (ZITHROMAX) 250 MG tablet, fluticasone (FLOVENT HFA) 44 MCG/ACT inhaler  Tobacco use  -Likely viral, but with low grade fevers and not getting better abx reasonable - zpack - risks discussed. ICS short term - risks discussed. Return precautions. -advised to quit smoking -Patient advised to return or notify a doctor immediately if symptoms worsen or persist or new concerns arise.  There are no Patient Instructions on file for this visit.   Kriste Basque R.

## 2012-09-24 ENCOUNTER — Other Ambulatory Visit: Payer: Self-pay | Admitting: Family Medicine

## 2013-02-08 ENCOUNTER — Emergency Department (HOSPITAL_COMMUNITY): Payer: BC Managed Care – PPO

## 2013-02-08 ENCOUNTER — Emergency Department (HOSPITAL_COMMUNITY)
Admission: EM | Admit: 2013-02-08 | Discharge: 2013-02-08 | Disposition: A | Discharge status: Home / Self Care | Payer: BC Managed Care – PPO | Attending: Emergency Medicine | Admitting: Emergency Medicine

## 2013-02-08 ENCOUNTER — Encounter (HOSPITAL_COMMUNITY): Payer: Self-pay | Admitting: Emergency Medicine

## 2013-02-08 DIAGNOSIS — E669 Obesity, unspecified: Secondary | ICD-10-CM | POA: Insufficient documentation

## 2013-02-08 DIAGNOSIS — F17200 Nicotine dependence, unspecified, uncomplicated: Secondary | ICD-10-CM | POA: Insufficient documentation

## 2013-02-08 DIAGNOSIS — Z79899 Other long term (current) drug therapy: Secondary | ICD-10-CM | POA: Insufficient documentation

## 2013-02-08 DIAGNOSIS — K8020 Calculus of gallbladder without cholecystitis without obstruction: Secondary | ICD-10-CM

## 2013-02-08 DIAGNOSIS — IMO0002 Reserved for concepts with insufficient information to code with codable children: Secondary | ICD-10-CM | POA: Insufficient documentation

## 2013-02-08 DIAGNOSIS — Z87448 Personal history of other diseases of urinary system: Secondary | ICD-10-CM | POA: Insufficient documentation

## 2013-02-08 DIAGNOSIS — Z872 Personal history of diseases of the skin and subcutaneous tissue: Secondary | ICD-10-CM | POA: Insufficient documentation

## 2013-02-08 DIAGNOSIS — Z7982 Long term (current) use of aspirin: Secondary | ICD-10-CM | POA: Insufficient documentation

## 2013-02-08 DIAGNOSIS — Z792 Long term (current) use of antibiotics: Secondary | ICD-10-CM | POA: Insufficient documentation

## 2013-02-08 LAB — CBC WITH DIFFERENTIAL/PLATELET
BASOS PCT: 0 % (ref 0–1)
Basophils Absolute: 0 10*3/uL (ref 0.0–0.1)
EOS ABS: 0.1 10*3/uL (ref 0.0–0.7)
EOS PCT: 0 % (ref 0–5)
HCT: 42.6 % (ref 39.0–52.0)
Hemoglobin: 15.4 g/dL (ref 13.0–17.0)
LYMPHS ABS: 1.7 10*3/uL (ref 0.7–4.0)
Lymphocytes Relative: 11 % — ABNORMAL LOW (ref 12–46)
MCH: 31.8 pg (ref 26.0–34.0)
MCHC: 36.2 g/dL — AB (ref 30.0–36.0)
MCV: 88 fL (ref 78.0–100.0)
MONOS PCT: 8 % (ref 3–12)
Monocytes Absolute: 1.2 10*3/uL — ABNORMAL HIGH (ref 0.1–1.0)
Neutro Abs: 12.4 10*3/uL — ABNORMAL HIGH (ref 1.7–7.7)
Neutrophils Relative %: 81 % — ABNORMAL HIGH (ref 43–77)
PLATELETS: 215 10*3/uL (ref 150–400)
RBC: 4.84 MIL/uL (ref 4.22–5.81)
RDW: 12.7 % (ref 11.5–15.5)
WBC: 15.3 10*3/uL — ABNORMAL HIGH (ref 4.0–10.5)

## 2013-02-08 LAB — URINALYSIS, ROUTINE W REFLEX MICROSCOPIC
BILIRUBIN URINE: NEGATIVE
Glucose, UA: NEGATIVE mg/dL
Hgb urine dipstick: NEGATIVE
Ketones, ur: 15 mg/dL — AB
Leukocytes, UA: NEGATIVE
NITRITE: NEGATIVE
PH: 5.5 (ref 5.0–8.0)
Protein, ur: NEGATIVE mg/dL
Specific Gravity, Urine: 1.031 — ABNORMAL HIGH (ref 1.005–1.030)
Urobilinogen, UA: 0.2 mg/dL (ref 0.0–1.0)

## 2013-02-08 LAB — COMPREHENSIVE METABOLIC PANEL
ALT: 16 U/L (ref 0–53)
AST: 18 U/L (ref 0–37)
Albumin: 3.7 g/dL (ref 3.5–5.2)
Alkaline Phosphatase: 72 U/L (ref 39–117)
BUN: 14 mg/dL (ref 6–23)
CO2: 24 meq/L (ref 19–32)
Calcium: 9 mg/dL (ref 8.4–10.5)
Chloride: 98 mEq/L (ref 96–112)
Creatinine, Ser: 0.81 mg/dL (ref 0.50–1.35)
GLUCOSE: 104 mg/dL — AB (ref 70–99)
POTASSIUM: 4.3 meq/L (ref 3.7–5.3)
SODIUM: 135 meq/L — AB (ref 137–147)
Total Bilirubin: 0.6 mg/dL (ref 0.3–1.2)
Total Protein: 6.9 g/dL (ref 6.0–8.3)

## 2013-02-08 LAB — LIPASE, BLOOD: Lipase: 25 U/L (ref 11–59)

## 2013-02-08 MED ORDER — ONDANSETRON 4 MG PO TBDP: 4.0000 mg | ORAL_TABLET | Freq: Three times a day (TID) | ORAL | Status: DC | PRN

## 2013-02-08 MED ORDER — ONDANSETRON HCL 4 MG/2ML IJ SOLN
4.0000 mg | Freq: Once | INTRAMUSCULAR | Status: AC
  Administered 2013-02-08: 4 mg via INTRAVENOUS
  Filled 2013-02-08: qty 2

## 2013-02-08 MED ORDER — SODIUM CHLORIDE 0.9 % IV BOLUS (SEPSIS)
1000.0000 mL | Freq: Once | INTRAVENOUS | Status: AC
  Administered 2013-02-08: 1000 mL via INTRAVENOUS

## 2013-02-08 MED ORDER — OXYCODONE-ACETAMINOPHEN 5-325 MG PO TABS: 1.0000 | ORAL_TABLET | Freq: Four times a day (QID) | ORAL | Status: DC | PRN

## 2013-02-08 MED ORDER — OXYCODONE-ACETAMINOPHEN 5-325 MG PO TABS
1.0000 | ORAL_TABLET | Freq: Once | ORAL | Status: AC
  Administered 2013-02-08: 1 via ORAL
  Filled 2013-02-08 (×2): qty 1

## 2013-02-08 MED ORDER — MORPHINE SULFATE 4 MG/ML IJ SOLN
4.0000 mg | Freq: Once | INTRAMUSCULAR | Status: AC
  Administered 2013-02-08: 4 mg via INTRAVENOUS
  Filled 2013-02-08: qty 1

## 2013-02-08 NOTE — ED Provider Notes (Signed)
CSN: [id number]     Arrival date & time 02/08/13  1230 History   First MD Initiated Contact with Patient 02/08/13 1521     Chief Complaint  Patient presents with  . Abdominal Pain  . Nausea   (Consider location/radiation/quality/duration/timing/severity/associated sxs/prior Treatment) HPI Comments: Patient is a 53 year old male past medical history significant for hyperlipidemia, tobacco he is presenting to the emergency department for moderate to severe acute onset right upper quadrant sharp stabbing pain without radiation began around 9:30 PM last evening after ingesting dinner. Patient states ibuprofen has helped minimally. He states repositioning aggravates his pain. He does endorse associated nausea. Patient denies any chest pain, shortness of breath, emesis, diarrhea, urinary symptoms. He denies any abdominal surgical history.  Patient is a 53 y.o. male presenting with abdominal pain.  Abdominal Pain Associated symptoms: nausea   Associated symptoms: no chest pain, no chills, no diarrhea, no fever, no shortness of breath and no vomiting     Past Medical History  Diagnosis Date  . Hyperlipidemia   . Obese   . Tobacco abuse   . ED (erectile dysfunction)   . Recurrent boils    Past Surgical History  Procedure Laterality Date  . Elbow fracture surgery      left   Family History  Problem Relation Age of Onset  . Heart disease Father   . Obesity Father   . Schizophrenia Father   . Kidney disease Mother   . Hearing loss Mother     due to whooping cough  . Multiple sclerosis Brother   . Hyperlipidemia Brother    History  Substance Use Topics  . Smoking status: Current Every Day Smoker    Types: Cigarettes  . Smokeless tobacco: Not on file  . Alcohol Use: No    Review of Systems  Constitutional: Negative for fever and chills.  Respiratory: Negative for shortness of breath.   Cardiovascular: Negative for chest pain.  Gastrointestinal: Positive for nausea and abdominal  pain. Negative for vomiting, diarrhea, blood in stool and anal bleeding.  Musculoskeletal: Negative for back pain.  All other systems reviewed and are negative.    Allergies  Review of patient's allergies indicates no known allergies.  Home Medications   Current Outpatient Rx  Name  Route  Sig  Dispense  Refill  . amitriptyline (ELAVIL) 25 MG tablet      Take one tablet by mouth   nightly at bedtime   90 tablet   1   . aspirin 81 MG tablet   Oral   Take 81 mg by mouth daily.           Marland Kitchen. azithromycin (ZITHROMAX) 250 MG tablet      2 tabs on the first day, then 1 tab daily for 4 days   6 each   0   . doxycycline (VIBRA-TABS) 100 MG tablet   Oral   Take 1 tablet (100 mg total) by mouth 2 (two) times daily.   50 tablet   2   . fluticasone (FLOVENT HFA) 44 MCG/ACT inhaler   Inhalation   Inhale 1 puff into the lungs 2 (two) times daily.   1 Inhaler   12   . mupirocin ointment (BACTROBAN) 2 %   Topical   Apply topically 3 (three) times daily.         . ondansetron (ZOFRAN ODT) 4 MG disintegrating tablet   Oral   Take 1 tablet (4 mg total) by mouth every 8 (eight) hours as  needed for nausea or vomiting.   10 tablet   0   . oxyCODONE-acetaminophen (PERCOCET/ROXICET) 5-325 MG per tablet   Oral   Take 1-2 tablets by mouth every 6 (six) hours as needed for severe pain.   20 tablet   0   . varenicline (CHANTIX) 1 MG tablet      One half tablet q.a.m.   30 tablet   4    BP 117/61  Pulse 59  Temp(Src) 98.2 F (36.8 C) (Oral)  Resp 18  SpO2 98% Physical Exam  Constitutional: He is oriented to person, place, and time. He appears well-developed and well-nourished. No distress.  HENT:  Head: Normocephalic and atraumatic.  Right Ear: External ear normal.  Left Ear: External ear normal.  Nose: Nose normal.  Mouth/Throat: No oropharyngeal exudate.  Eyes: Conjunctivae are normal.  Neck: Neck supple.  Cardiovascular: Normal rate, regular rhythm and normal  heart sounds.   Pulmonary/Chest: Effort normal and breath sounds normal. No respiratory distress. He has no wheezes. He has no rales. He exhibits no tenderness.  Abdominal: Soft. Bowel sounds are normal. There is tenderness in the right upper quadrant. There is no rigidity, no rebound and negative Murphy's sign.  Musculoskeletal: Normal range of motion.  Neurological: He is alert and oriented to person, place, and time.  Skin: Skin is warm and dry. He is not diaphoretic.    ED Course  Procedures (including critical care time) Medications  oxyCODONE-acetaminophen (PERCOCET/ROXICET) 5-325 MG per tablet 1-2 tablet (not administered)  sodium chloride 0.9 % bolus 1,000 mL (0 mLs Intravenous Stopped 02/08/13 1745)  morphine 4 MG/ML injection 4 mg (4 mg Intravenous Given 02/08/13 1617)  ondansetron (ZOFRAN) injection 4 mg (4 mg Intravenous Given 02/08/13 1617)    Labs Review Labs Reviewed  CBC WITH DIFFERENTIAL - Abnormal; Notable for the following:    WBC 15.3 (*)    MCHC 36.2 (*)    Neutrophils Relative % 81 (*)    Neutro Abs 12.4 (*)    Lymphocytes Relative 11 (*)    Monocytes Absolute 1.2 (*)    All other components within normal limits  COMPREHENSIVE METABOLIC PANEL - Abnormal; Notable for the following:    Sodium 135 (*)    Glucose, Bld 104 (*)    All other components within normal limits  URINALYSIS, ROUTINE W REFLEX MICROSCOPIC - Abnormal; Notable for the following:    Specific Gravity, Urine 1.031 (*)    Ketones, ur 15 (*)    All other components within normal limits  URINE CULTURE  LIPASE, BLOOD  TROPONIN I   Imaging Review Dg Chest 2 View  02/08/2013   CLINICAL DATA:  Sharp right upper quadrant abdominal pain. No chest complaints. Smoker.  EXAM: CHEST  2 VIEW  COMPARISON:  None.  FINDINGS: The heart size and mediastinal contours are normal. The lungs are clear. There is no pleural effusion or pneumothorax. No acute osseous findings are identified. The visualized upper abdomen  appears unremarkable.  IMPRESSION: No active cardiopulmonary process.   Electronically Signed   By: Roxy Horseman M.D.   On: 02/08/2013 16:58   US Abdomen Complete  02/08/2013   CLINICAL DATA:  Right upper quadrant pain, nausea.  EXAM: ULTRASOUND ABDOMEN COMPLETE  COMPARISON:  None.  FINDINGS: Gallbladder:  18 mm gallstone in the region of the gallbladder mid neck. Multiple other smaller gallstones. Mild wall thickening, measuring 4 mm. Negative sonographic Murphy's sign.  Common bile duct:  Diameter: Normal caliber, 6 mm.  Liver:  No focal lesion identified. Within normal limits in parenchymal echogenicity.  IVC:  No abnormality visualized.  Pancreas:  Visualized portion unremarkable.  Spleen:  Size and appearance within normal limits.  Right Kidney:  Length: 11.5 cm. Echogenicity within normal limits. No mass or hydronephrosis visualized.  Left Kidney:  Length: 12.6 cm. Echogenicity within normal limits. No mass or hydronephrosis visualized.  Abdominal aorta:  No aneurysm visualized.  Other findings:  None.  IMPRESSION: Multiple gallstones, the largest is a non mobile gallstone in the region of the gallbladder neck measuring 18 mm. Mild wall thickening without sonographic Murphy's sign.   Electronically Signed   By: Charlett Nose M.D.   On: 02/08/2013 17:35    EKG Interpretation   None       MDM   1. Cholelithiasis without cholecystitis     Filed Vitals:   02/08/13 1845  BP: 117/61  Pulse: 59  Temp:   Resp:     Afebrile, NAD, non-toxic appearing, AAOx4. Abdomen soft, minimally tender in RUQ w/o Murphy sign, no distention. Normal bowel sounds present. No guarding, rigidity, or rebound appreciated. I have reviewed nursing notes, vital signs, and all appropriate lab and imaging results for this patient. Minimal leukocytosis noted. CMP within normal limits. Chest x-ray negative. Abdominal ultrasound significant for 18 mm gallstone without obstruction. Mild gallbladder wall thickening. Negative  ultra sonographic Murphy sign. No evidence of cholecystitis. Pain has been managed in ED. On repeat examination abdomen continues to be benign. Return precautions discussed. Advised patient follow up with general surgery as an outpatient to discuss cholelithiasis and management. Patient is agreeable to plan. Patient is stable to discharge. Patient d/w with Dr. Redgie Grayer, agrees with plan.      Jeannetta Ellis, PA-C 02/09/13 [phone]

## 2013-02-08 NOTE — ED Notes (Signed)
Pt presents to department for evaluation of epigastric pain and nausea. Onset last night after eating dinner. 8/10 pain upon arrival to ED. Denies urinary symptoms. Pt is conscious alert and oriented x4.

## 2013-02-08 NOTE — ED Notes (Signed)
Denies nausea  

## 2013-02-08 NOTE — Discharge Instructions (Signed)
Please follow up with your primary care physician in 1-2 days. If you do not have one please call the [facility] number listed above.Please follow up with the General Surgeon, Dr. Harlon Florseui, to schedule a follow up appointment. Please take pain medication as prescribed and as needed for pain. Please do not drive on narcotic pain medication. Please read all discharge instructions and return precautions.   Cholelithiasis Cholelithiasis (also called gallstones) is a form of gallbladder disease in which gallstones form in your gallbladder. The gallbladder is an organ that stores bile made in the liver, which helps digest fats. Gallstones begin as small crystals and slowly grow into stones. Gallstone pain occurs when the gallbladder spasms and a gallstone is blocking the duct. Pain can also occur when a stone passes out of the duct.  RISK FACTORS  Being male.   Having multiple pregnancies. Health care providers sometimes advise removing diseased gallbladders before future pregnancies.   Being obese.  Eating a diet heavy in fried foods and fat.   Being older than 60 years and increasing age.   Prolonged use of medicines containing male hormones.   Having diabetes mellitus.   Rapidly losing weight.   Having a family history of gallstones (heredity).  SYMPTOMS  Nausea.   Vomiting.  Abdominal pain.   Yellowing of the skin (jaundice).   Sudden pain. It may persist from several minutes to several hours.  Fever.   Tenderness to the touch. In some cases, when gallstones do not move into the bile duct, people have no pain or symptoms. These are called "silent" gallstones.  TREATMENT Silent gallstones do not need treatment. In severe cases, emergency surgery may be required. Options for treatment include:  Surgery to remove the gallbladder. This is the most common treatment.  Medicines. These do not always work and may take 6 12 months or more to  work.  Shock wave treatment (extracorporeal biliary lithotripsy). In this treatment an ultrasound machine sends shock waves to the gallbladder to break gallstones into smaller pieces that can pass into the intestines or be dissolved by medicine. HOME CARE INSTRUCTIONS   Only take over-the-counter or prescription medicines for pain, discomfort, or fever as directed by your health care provider.   Follow a low-fat diet until seen again by your health care provider. Fat causes the gallbladder to contract, which can result in pain.   Follow up with your health care provider as directed. Attacks are almost always recurrent and surgery is usually required for permanent treatment.  SEEK IMMEDIATE MEDICAL CARE IF:   Your pain increases and is not controlled by medicines.   You have a fever or persistent symptoms for more than 2 3 days.   You have a fever and your symptoms suddenly get worse.   You have persistent nausea and vomiting.  MAKE SURE YOU:   Understand these instructions.  Will watch your condition.  Will get help right away if you are not doing well or get worse. Document Released: 12/14/2004 Document Revised: 08/20/2012 Document Reviewed: 06/11/2012 [facility] Patient Information 2014 [employer] [place].

## 2013-02-09 NOTE — ED Provider Notes (Signed)
Medical screening examination/treatment/procedure(s) were performed by non-physician practitioner and as supervising physician I was immediately available for consultation/collaboration.  EKG Interpretation   None         David Masneri, MD 02/09/13 1328 

## 2013-02-10 ENCOUNTER — Encounter: Payer: Self-pay | Admitting: Family Medicine

## 2013-02-10 ENCOUNTER — Ambulatory Visit (INDEPENDENT_AMBULATORY_CARE_PROVIDER_SITE_OTHER): Payer: BC Managed Care – PPO | Admitting: Family Medicine

## 2013-02-10 VITALS — BP 120/80 | Temp 97.9°F | Wt 247.0 lb

## 2013-02-10 DIAGNOSIS — K829 Disease of gallbladder, unspecified: Secondary | ICD-10-CM

## 2013-02-10 DIAGNOSIS — F17200 Nicotine dependence, unspecified, uncomplicated: Secondary | ICD-10-CM

## 2013-02-10 LAB — URINE CULTURE
CULTURE: NO GROWTH
Colony Count: NO GROWTH

## 2013-02-10 MED ORDER — AMITRIPTYLINE HCL 25 MG PO TABS: ORAL_TABLET | ORAL | Status: DC

## 2013-02-10 NOTE — Progress Notes (Signed)
   Subjective:    Patient ID: Marc Brown, male    DOB: 07-13-1960, 53 y.o.   MRN: [id number]  HPI Marc Brown is a 53 year old male smoker........ one pack-a-day....... who comes in today for followup having been in the emergency room for evaluation of acute abdominal pain  He went emerge from on February 8 at 5:30 PM with acute abdominal pain. He says he's never had episodes like this before although in questioning he said he's had some bouts of "" indigestion after eating in the past. These may have been many gallbladder attacks  This episode he has severe pain. X-ray showed an 18 mm stone in the region of the gallbladder neck with multiple small stones and gallbladder wall thickening consistent with previous episodes of cholecystitis. His white count was appropriate elevated chest x-ray was normal amylase and lipase were normal. Since that time she's been on a fat free diet has been asymptomatic  His mother and sister both had gallbladder disease  We have given him a prescription for Chantix in the past. He's purchased a medicine but has not taken it yet.   Review of Systems Review of systems otherwise negative    Objective:   Physical Exam  Well-developed well-nourished male no acute distress vital signs stable he is afebrile      Assessment & Plan:  Gallbladder disease........Marland Kitchen. general surgery consult this week......... fat-free diet in the meantime  Tobacco abuse.......... again encouraged smoking cessation program

## 2013-02-10 NOTE — Patient Instructions (Signed)
Chantix 1 mg......... one half tab daily in the morning  Begin a tapering program  Continue fat-free diet

## 2013-02-10 NOTE — Progress Notes (Signed)
Pre visit review using our clinic review tool, if applicable. No additional management support is needed unless otherwise documented below in the visit note. 

## 2013-02-11 ENCOUNTER — Telehealth: Payer: Self-pay | Admitting: Family Medicine

## 2013-02-11 NOTE — Telephone Encounter (Signed)
Relevant patient education assigned to patient using Emmi. ° °

## 2013-02-12 ENCOUNTER — Encounter (HOSPITAL_COMMUNITY): Payer: Self-pay

## 2013-02-12 ENCOUNTER — Encounter (INDEPENDENT_AMBULATORY_CARE_PROVIDER_SITE_OTHER): Payer: Self-pay | Admitting: General Surgery

## 2013-02-12 ENCOUNTER — Encounter (HOSPITAL_COMMUNITY): Payer: Self-pay | Admitting: *Deleted

## 2013-02-12 ENCOUNTER — Ambulatory Visit (INDEPENDENT_AMBULATORY_CARE_PROVIDER_SITE_OTHER): Payer: BC Managed Care – PPO | Admitting: General Surgery

## 2013-02-12 ENCOUNTER — Encounter (HOSPITAL_COMMUNITY)
Admission: RE | Admit: 2013-02-12 | Discharge: 2013-02-12 | Disposition: A | Discharge status: Home / Self Care | Payer: BC Managed Care – PPO | Source: Ambulatory Visit | Attending: General Surgery | Admitting: General Surgery

## 2013-02-12 ENCOUNTER — Other Ambulatory Visit: Payer: Self-pay | Admitting: Family Medicine

## 2013-02-12 VITALS — BP 110/76 | HR 80 | Temp 98.9°F | Resp 18 | Ht 72.0 in | Wt 239.0 lb

## 2013-02-12 DIAGNOSIS — K829 Disease of gallbladder, unspecified: Secondary | ICD-10-CM

## 2013-02-12 NOTE — Pre-Procedure Instructions (Addendum)
Marc Brown  02/12/2013   Your procedure is scheduled on: Saturday, February 14.  Report to emergency Department  at 6:00AM.  Call this number if you have problems the morning of surgery: [phone]   Remember:   Do not eat food or drink liquids after midnight.   Take these medicines the morning of surgery with A SIP OF WATER: varenicline (CHANTIX).  Take if needed:oxyCODONE-acetaminophen (PERCOCET/ROXICET).  Stop taking Aspirin, Coumadin, Plavix, Effient and Herbal medications.  Don not take any NSAIDs ie: Ibuprofen,  Advil,Naproxen or any medication containing Aspirin.     Do not wear jewelry, make-up or nail polish.  Do not wear lotions, powders, or perfumes. You may wear deodorant.  Do not shave 48 hours prior to surgery. Men may shave face and neck.  Do not bring valuables to the hospital.  [facility] is not responsible  for any belongings or valuables.               Contacts, dentures or bridgework may not be worn into surgery.  Leave suitcase in the car. After surgery it may be brought to your room.  For patients admitted to the hospital, discharge time is determined by your  treatment team.               Patients discharged the day of surgery will not be allowed to drive home.  Name and phone number of your driver: -               Please read over the following fact sheets that you were given: Pain Booklet, Coughing and Deep Breathing and Surgical Site Infection Prevention

## 2013-02-12 NOTE — Progress Notes (Signed)
Patient ID: Marc Brown, male   DOB: Sep 28, 1960, 53 y.o.   MRN: [id number]  Chief Complaint  Patient presents with  . New Evaluation    Eval gallbladder    HPI Marc Brown is a 53 y.o. male.  He is referred by Dr. Alonza SmokerJeff Todd evaluation and management of symptomatic gallstones.  Last weekend he had the onset of epigastric pain that was severe, lasted 8 hours. This was after eating chicken potpie. He was nauseated but did not vomit. When the pain persisted and migrated to the right upper quadrant he went to the emergency department. All of his lab work was normal. Ultrasound shows numerous gallstones, one very large stone in the neck of the gallbladder. Symptoms subsided. He saw Dr. Tawanna Coolerodd and was advised to have cholecystectomy. That evening he had a second attack which then has resolved. He is afraid to eat and is fearful that he'll have another attack.  He wants to get something done because he's going to [place] on March 1. He is in no distress today.  Comorbidities include daily tobacco use. No other significant comorbidities.  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 No children. Family history shows that mother and sister have had their gallbladder removed, although one of them actually had duodenal carcinoma. He is concerned about that as well.  HPI  Past Medical History  Diagnosis Date  . Hyperlipidemia   . Obese   . Tobacco abuse   . ED (erectile dysfunction)   . Recurrent boils     Past Surgical History  Procedure Laterality Date  . Elbow fracture surgery      left    Family History  Problem Relation Age of Onset  . Heart disease Father   . Obesity Father   . Schizophrenia Father   . Kidney disease Mother   . Hearing loss Mother     due to whooping cough  . Multiple sclerosis Brother   . Hyperlipidemia Brother     Social History History  Substance Use Topics  . Smoking status: Current Every Day Smoker    Types: Cigarettes  . Smokeless tobacco:  Not on file  . Alcohol Use: No    No Known Allergies  Current Outpatient Prescriptions  Medication Sig Dispense Refill  . amitriptyline (ELAVIL) 25 MG tablet Take 25 mg by mouth at bedtime.      Marland Kitchen. aspirin 81 MG tablet Take 81 mg by mouth at bedtime.       Marland Kitchen. ibuprofen (ADVIL,MOTRIN) 200 MG tablet Take 400 mg by mouth every 6 (six) hours as needed for moderate pain.      Marland Kitchen. ondansetron (ZOFRAN ODT) 4 MG disintegrating tablet Take 1 tablet (4 mg total) by mouth every 8 (eight) hours as needed for nausea or vomiting.  10 tablet  0  . oxyCODONE-acetaminophen (PERCOCET/ROXICET) 5-325 MG per tablet Take 1-2 tablets by mouth every 6 (six) hours as needed for severe pain.  20 tablet  0  . varenicline (CHANTIX) 1 MG tablet One half tablet q.a.m.  30 tablet  4   No current facility-administered medications for this visit.    Review of Systems Review of Systems  Constitutional: Negative for fever, chills and unexpected weight change.  HENT: Negative for congestion, hearing loss, sore throat, trouble swallowing and voice change.   Eyes: Negative for visual disturbance.  Respiratory: Negative for cough and wheezing.   Cardiovascular: Negative for chest pain, palpitations and leg swelling.  Gastrointestinal: Positive  for nausea and abdominal pain. Negative for vomiting, diarrhea, constipation, blood in stool, abdominal distention, anal bleeding and rectal pain.  Genitourinary: Negative for hematuria and difficulty urinating.  Musculoskeletal: Negative for arthralgias.  Skin: Negative for rash and wound.  Neurological: Negative for seizures, syncope, weakness and headaches.  Hematological: Negative for adenopathy. Does not bruise/bleed easily.  Psychiatric/Behavioral: Negative for confusion.    Blood pressure 110/76, pulse 80, temperature 98.9 F (37.2 C), resp. rate 18, height 6' (1.829 m), weight 239 lb (108.41 kg).  Physical Exam Physical Exam  Constitutional: He is oriented to person,  place, and time. He appears well-developed and well-nourished. No distress.  Very pleasant. Intelligent. No distress. Alert of tobacco.  HENT:  Head: Normocephalic.  Nose: Nose normal.  Mouth/Throat: No oropharyngeal exudate.  Eyes: Conjunctivae and EOM are normal. Pupils are equal, round, and reactive to light. Right eye exhibits no discharge. Left eye exhibits no discharge. No scleral icterus.  Neck: Normal range of motion. Neck supple. No JVD present. No tracheal deviation present. No thyromegaly present.  Cardiovascular: Normal rate, regular rhythm, normal heart sounds and intact distal pulses.   No murmur heard. Pulmonary/Chest: Effort normal and breath sounds normal. No stridor. No respiratory distress. He has no wheezes. He has no rales. He exhibits no tenderness.  Abdominal: Soft. Bowel sounds are normal. He exhibits no distension and no mass. There is no tenderness. There is no rebound and no guarding.  Musculoskeletal: Normal range of motion. He exhibits no edema and no tenderness.  Lymphadenopathy:    He has no cervical adenopathy.  Neurological: He is alert and oriented to person, place, and time. He has normal reflexes. Coordination normal.  Skin: Skin is warm and dry. No rash noted. He is not diaphoretic. No erythema. No pallor.  Psychiatric: He has a normal mood and affect. His behavior is normal. Judgment and thought content normal.    Data Reviewed Office records, lab and x-rays.  Assessment    Chronic cholecystitis with cholelithiasis. Now with 2 episodes of severe biliary colic.  daily tobacco abuse       Plan    At the patient's request, he will be scheduled for laparoscopic cholecystectomy with cholangiogram, possible open  I discussed the indications, details, techniques, and numerous risk of the surgery with him. He is aware of the risk of bleeding, infection, conversion to open laparotomy, wound hernia, bile leak with readmission, injury to adjacent organs  such the main bile duct or intestine with major reconstructive surgery, and other unforseen problems. He understands all these issues and all of his questions are answered. He agrees with this plan.        Angelia Mould. Derrell Lolling, M.D., [facility] General and Minimally invasive Surgery Breast and Colorectal Surgery Office:   [phone] Pager:   [phone]  02/12/2013, 9:27 AM

## 2013-02-12 NOTE — Progress Notes (Signed)
[facility] - Preparing for Surgery  Before surgery, you can play an important role.  Because skin is not sterile, your skin needs to be as free of germs as possible.  You can reduce the number of germs on you skin by washing with CHG (chlorahexidine gluconate) soap before surgery.  CHG is an antiseptic cleaner which kills germs and bonds with the skin to continue killing germs even after washing.  Please DO NOT use if you have an allergy to CHG or antibacterial soaps.  If your skin becomes reddened/irritated stop using the CHG and inform your nurse when you arrive at Short Stay.  Do not shave (including legs and underarms) for at least 48 hours prior to the first CHG shower.  You may shave your face.  Please follow these instructions carefully:   1.  Shower with CHG Soap the night before surgery and the morning of Surgery.  2.  If you choose to wash your hair, wash your hair first as usual with your normal shampoo.  3.  After you shampoo, rinse your hair and body thoroughly to remove the  Shampoo.  4.  Use CHG as you would any other liquid soap.  You can apply chg directly to the skin and wash gently with scrungie or a clean washcloth.  5.  Apply the CHG Soap to your body ONLY FROM THE NECK DOWN.        Do not use on open wounds or open sores.  Avoid contact with your eyes,ears, mouth and genitals (private parts).  Wash genitals (private parts)with your normal soap.  6.  Wash thoroughly, paying special attention to the area where your surgery will be performed.  7.  Thoroughly rinse your body with warm water from the neck down.  8.  DO NOT shower/wash with your normal soap after using and rinsing off the CHG Soap.  9.  Pat yourself dry with a clean towel.            10.  Wear clean pajamas.            11.  Place clean sheets on your bed the night of your first shower and do not sleep with pets.  Day of Surgery  Do not apply any lotions/deodorants the morning of surgery.  Please wear clean  clothes to the hospital/surgery center. 

## 2013-02-12 NOTE — Patient Instructions (Signed)
You have recently had 2 relatively severe gallbladder attacks. Your gallbladder is clearly diseased as demonstrated by numerous gallstones on her ultrasound. Fortunately, there had been no complications to date.  At your request, we will schedule you for laparoscopic cholecystectomy with cholangiogram, possible open, in the near future.      Laparoscopic Cholecystectomy Laparoscopic cholecystectomy is surgery to remove the gallbladder. The gallbladder is located in the upper right part of the abdomen, behind the liver. It is a storage sac for bile produced in the liver. Bile aids in the digestion and absorption of fats. Cholecystectomy is often done for inflammation of the gallbladder (cholecystitis). This condition is usually caused by a buildup of gallstones (cholelithiasis) in your gallbladder. Gallstones can block the flow of bile, resulting in inflammation and pain. In severe cases, emergency surgery may be required. When emergency surgery is not required, you will have time to prepare for the procedure. Laparoscopic surgery is an alternative to open surgery. Laparoscopic surgery has a shorter recovery time. Your common bile duct may also need to be examined during the procedure. If stones are found in the common bile duct, they may be removed. LET [facility] KNOW ABOUT:  Any allergies you have.  All medicines you are taking, including vitamins, herbs, eye drops, creams, and over-the-counter medicines.  Previous problems you or members of your family have had with the use of anesthetics.  Any blood disorders you have.  Previous surgeries you have had.  Medical conditions you have. RISKS AND COMPLICATIONS Generally, this is a safe procedure. However, as with any procedure, complications can occur. Possible complications include:  Infection.  Damage to the common bile duct, nerves, arteries, veins, or other internal organs such as the stomach, liver, or  intestines.  Bleeding.  A stone may remain in the common bile duct.  A bile leak from the cyst duct that is clipped when your gallbladder is removed.  The need to convert to open surgery, which requires a larger incision in the abdomen. This may be necessary if your surgeon thinks it is not safe to continue with a laparoscopic procedure. BEFORE THE PROCEDURE  Ask your health care provider about changing or stopping any regular medicines. You will need to stop taking aspirin or blood thinners at least 5 days prior to surgery.  Do not eat or drink anything after midnight the night before surgery.  Let your health care provider know if you develop a cold or other infectious problem before surgery. PROCEDURE   You will be given medicine to make you sleep through the procedure (general anesthetic). A breathing tube will be placed in your mouth.  When you are asleep, your surgeon will make several small cuts (incisions) in your abdomen.  A thin, lighted tube with a tiny camera on the end (laparoscope) is inserted through one of the small incisions. The camera on the laparoscope sends a picture to a TV screen in the operating room. This gives the surgeon a good view inside your abdomen.  A gas will be pumped into your abdomen. This expands your abdomen so that the surgeon has more room to perform the surgery.  Other tools needed for the procedure are inserted through the other incisions. The gallbladder is removed through one of the incisions.  After the removal of your gallbladder, the incisions will be closed with stitches, staples, or skin glue. AFTER THE PROCEDURE  You will be taken to a recovery area where your progress will be checked  often.  You may be allowed to go home the same day if your pain is controlled and you can tolerate liquids. Document Released: 12/18/2004 Document Revised: 10/08/2012 Document Reviewed: 07/30/2012 [facility] Patient Information 2014 [place].

## 2013-02-13 MED ORDER — CHLORHEXIDINE GLUCONATE 4 % EX LIQD
1.0000 "application " | Freq: Once | CUTANEOUS | Status: DC
  Filled 2013-02-13: qty 15

## 2013-02-13 MED ORDER — CEFAZOLIN SODIUM-DEXTROSE 2-3 GM-% IV SOLR
2.0000 g | INTRAVENOUS | Status: AC
  Administered 2013-02-14: 2 g via INTRAVENOUS
  Filled 2013-02-13: qty 50

## 2013-02-13 NOTE — H&P (Signed)
Marc Brown   MRN:  [id number]   Description: 53 year old male  Provider: Ernestene MentionHaywood M Areeb Corron, MD  Department: [facility]         Diagnoses      Disease of gallbladder    -  Primary      575.9               Current Vitals - Last Recorded      BP Pulse Temp(Src) Resp Ht Wt      110/76 80 98.9 F (37.2 C) 18 6' (1.829 m) 239 lb (108.41 kg)      BMI 32.41 kg/m2                      History and Physical   Ernestene MentionHaywood M Mercer Peifer, MD      Status: Signed            Patient ID: Marc SpellSamuel D Matuska, male   DOB: Dec 07, 1960, 53 y.o.   MRN: [id number]               HPI Marc SpellSamuel D Kirker is a 53 y.o. male.  He is referred by Dr. Alonza SmokerJeff Todd evaluation and management of symptomatic gallstones.   Last weekend he had the onset of epigastric pain that was severe, lasted 8 hours. This was after eating chicken potpie. He was nauseated but did not vomit. When the pain persisted and migrated to the right upper quadrant he went to the emergency department. All of his lab work was normal. Ultrasound shows numerous gallstones, one very large stone in the neck of the gallbladder. Symptoms subsided. He saw Dr. Tawanna Coolerodd and was advised to have cholecystectomy. That evening he had a second attack which then has resolved. He is afraid to eat and is fearful that he'll have another attack.   He wants to get something done because [facility]. He is in no distress today.   Comorbidities include daily tobacco use. No other significant comorbidities.   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 Family history shows that mother and sister have had their gallbladder removed, although one of them actually had duodenal carcinoma. He is concerned about that as well.         Past Medical History   Diagnosis  Date   .  Hyperlipidemia     .  Obese     .  Tobacco abuse     .  ED (erectile dysfunction)     .  Recurrent boils           Past Surgical History    Procedure  Laterality  Date   .  Elbow fracture surgery           left         Family History   Problem  Relation  Age of Onset   .  Heart disease  Father     .  Obesity  Father     .  Schizophrenia  Father     .  Kidney disease  Mother     .  Hearing loss  Mother         due to whooping cough   .  Multiple sclerosis  Brother     .  Hyperlipidemia  Brother          Social History History   Substance Use Topics   .  Smoking  status:  Current Every Day Smoker       Types:  Cigarettes   .  Smokeless tobacco:  Not on file   .  Alcohol Use:  No        No Known Allergies    Current Outpatient Prescriptions   Medication  Sig  Dispense  Refill   .  amitriptyline (ELAVIL) 25 MG tablet  Take 25 mg by mouth at bedtime.         Marland Kitchen  aspirin 81 MG tablet  Take 81 mg by mouth at bedtime.          Marland Kitchen  ibuprofen (ADVIL,MOTRIN) 200 MG tablet  Take 400 mg by mouth every 6 (six) hours as needed for moderate pain.         Marland Kitchen  ondansetron (ZOFRAN ODT) 4 MG disintegrating tablet  Take 1 tablet (4 mg total) by mouth every 8 (eight) hours as needed for nausea or vomiting.   10 tablet   0   .  oxyCODONE-acetaminophen (PERCOCET/ROXICET) 5-325 MG per tablet  Take 1-2 tablets by mouth every 6 (six) hours as needed for severe pain.   20 tablet   0   .  varenicline (CHANTIX) 1 MG tablet  One half tablet q.a.m.   30 tablet   4       No current facility-administered medications for this visit.        Review of Systems   Constitutional: Negative for fever, chills and unexpected weight change.  HENT: Negative for congestion, hearing loss, sore throat, trouble swallowing and voice change.   Eyes: Negative for visual disturbance.  Respiratory: Negative for cough and wheezing.   Cardiovascular: Negative for chest pain, palpitations and leg swelling.  Gastrointestinal: Positive for nausea and abdominal pain. Negative for vomiting, diarrhea, constipation, blood in stool, abdominal distention, anal  bleeding and rectal pain.  Genitourinary: Negative for hematuria and difficulty urinating.  Musculoskeletal: Negative for arthralgias.  Skin: Negative for rash and wound.  Neurological: Negative for seizures, syncope, weakness and headaches.  Hematological: Negative for adenopathy. Does not bruise/bleed easily.  Psychiatric/Behavioral: Negative for confusion.      Blood pressure 110/76, pulse 80, temperature 98.9 F (37.2 C), resp. rate 18, height 6' (1.829 m), weight 239 lb (108.41 kg).   Physical Exam   Constitutional: He is oriented to person, place, and time. He appears well-developed and well-nourished. No distress.  Very pleasant. Intelligent. No distress. Alert of tobacco.  HENT:   Head: Normocephalic.   Nose: Nose normal.   Mouth/Throat: No oropharyngeal exudate.  Eyes: Conjunctivae and EOM are normal. Pupils are equal, round, and reactive to light. Right eye exhibits no discharge. Left eye exhibits no discharge. No scleral icterus.  Neck: Normal range of motion. Neck supple. No JVD present. No tracheal deviation present. No thyromegaly present.  Cardiovascular: Normal rate, regular rhythm, normal heart sounds and intact distal pulses.    No murmur heard. Pulmonary/Chest: Effort normal and breath sounds normal. No stridor. No respiratory distress. He has no wheezes. He has no rales. He exhibits no tenderness.  Abdominal: Soft. Bowel sounds are normal. He exhibits no distension and no mass. There is no tenderness. There is no rebound and no guarding.  Musculoskeletal: Normal range of motion. He exhibits no edema and no tenderness.  Lymphadenopathy:    He has no cervical adenopathy.  Neurological: He is alert and oriented to person, place, and time. He has normal reflexes. Coordination normal.  Skin: Skin is warm and  dry. No rash noted. He is not diaphoretic. No erythema. No pallor.  Psychiatric: He has a normal mood and affect. His behavior is normal. Judgment and thought  content normal.      Data Reviewed Office records, lab and x-rays.   Assessment    Chronic cholecystitis with cholelithiasis. Now with 2 episodes of severe biliary colic.   daily tobacco abuse        Plan    At the patient's request, he will be scheduled for laparoscopic cholecystectomy with cholangiogram, possible open   I discussed the indications, details, techniques, and numerous risk of the surgery with him. He is aware of the risk of bleeding, infection, conversion to open laparotomy, wound hernia, bile leak with readmission, injury to adjacent organs such the main bile duct or intestine with major reconstructive surgery, and other unforseen problems. He understands all these issues and all of his questions are answered. He agrees with this plan.         Angelia Mould. Derrell Lolling, M.D., [facility] General and Minimally invasive Surgery Breast and Colorectal Surgery Office:   [phone] Pager:   [phone]

## 2013-02-14 ENCOUNTER — Encounter (HOSPITAL_COMMUNITY)
Admission: RE | Disposition: A | Discharge status: Home / Self Care | Payer: Self-pay | Source: Ambulatory Visit | Attending: General Surgery

## 2013-02-14 ENCOUNTER — Encounter (HOSPITAL_COMMUNITY): Payer: BC Managed Care – PPO | Admitting: Anesthesiology

## 2013-02-14 ENCOUNTER — Encounter (HOSPITAL_COMMUNITY): Payer: Self-pay | Admitting: *Deleted

## 2013-02-14 ENCOUNTER — Ambulatory Visit (HOSPITAL_COMMUNITY)
Admission: RE | Admit: 2013-02-14 | Discharge: 2013-02-15 | Disposition: A | Discharge status: Home / Self Care | Payer: BC Managed Care – PPO | Source: Ambulatory Visit | Attending: General Surgery | Admitting: General Surgery

## 2013-02-14 ENCOUNTER — Ambulatory Visit (HOSPITAL_COMMUNITY): Payer: BC Managed Care – PPO | Admitting: Anesthesiology

## 2013-02-14 DIAGNOSIS — K8010 Calculus of gallbladder with chronic cholecystitis without obstruction: Secondary | ICD-10-CM

## 2013-02-14 DIAGNOSIS — Z23 Encounter for immunization: Secondary | ICD-10-CM | POA: Insufficient documentation

## 2013-02-14 DIAGNOSIS — E785 Hyperlipidemia, unspecified: Secondary | ICD-10-CM | POA: Insufficient documentation

## 2013-02-14 DIAGNOSIS — K829 Disease of gallbladder, unspecified: Secondary | ICD-10-CM | POA: Diagnosis present

## 2013-02-14 DIAGNOSIS — Z6832 Body mass index (BMI) 32.0-32.9, adult: Secondary | ICD-10-CM | POA: Insufficient documentation

## 2013-02-14 DIAGNOSIS — K8000 Calculus of gallbladder with acute cholecystitis without obstruction: Secondary | ICD-10-CM | POA: Diagnosis present

## 2013-02-14 DIAGNOSIS — E669 Obesity, unspecified: Secondary | ICD-10-CM | POA: Insufficient documentation

## 2013-02-14 DIAGNOSIS — F17200 Nicotine dependence, unspecified, uncomplicated: Secondary | ICD-10-CM | POA: Insufficient documentation

## 2013-02-14 HISTORY — PX: CHOLECYSTECTOMY: SHX55

## 2013-02-14 SURGERY — LAPAROSCOPIC CHOLECYSTECTOMY
Anesthesia: General | Site: Abdomen

## 2013-02-14 MED ORDER — DEXTROSE 5 % IV SOLN
2.0000 g | Freq: Two times a day (BID) | INTRAVENOUS | Status: DC
  Administered 2013-02-14 – 2013-02-15 (×3): 2 g via INTRAVENOUS
  Filled 2013-02-14 (×4): qty 2

## 2013-02-14 MED ORDER — MIDAZOLAM HCL 5 MG/5ML IJ SOLN
INTRAMUSCULAR | Status: DC | PRN
  Administered 2013-02-14: 2 mg via INTRAVENOUS

## 2013-02-14 MED ORDER — LIDOCAINE HCL (CARDIAC) 20 MG/ML IV SOLN
INTRAVENOUS | Status: DC | PRN
  Administered 2013-02-14: 20 mg via INTRAVENOUS

## 2013-02-14 MED ORDER — ROCURONIUM BROMIDE 100 MG/10ML IV SOLN
INTRAVENOUS | Status: DC | PRN
  Administered 2013-02-14: 40 mg via INTRAVENOUS
  Administered 2013-02-14: 10 mg via INTRAVENOUS

## 2013-02-14 MED ORDER — PNEUMOCOCCAL VAC POLYVALENT 25 MCG/0.5ML IJ INJ
0.5000 mL | INJECTION | INTRAMUSCULAR | Status: AC
  Administered 2013-02-15: 0.5 mL via INTRAMUSCULAR
  Filled 2013-02-14: qty 0.5

## 2013-02-14 MED ORDER — BUPIVACAINE-EPINEPHRINE (PF) 0.25% -1:200000 IJ SOLN
INTRAMUSCULAR | Status: AC
  Filled 2013-02-14: qty 30

## 2013-02-14 MED ORDER — AMITRIPTYLINE HCL 25 MG PO TABS
25.0000 mg | ORAL_TABLET | Freq: Every day | ORAL | Status: DC
  Administered 2013-02-14: 25 mg via ORAL
  Filled 2013-02-14 (×2): qty 1

## 2013-02-14 MED ORDER — NEOSTIGMINE METHYLSULFATE 1 MG/ML IJ SOLN
INTRAMUSCULAR | Status: AC
  Filled 2013-02-14: qty 10

## 2013-02-14 MED ORDER — HEMOSTATIC AGENTS (NO CHARGE) OPTIME: TOPICAL | Status: DC | PRN

## 2013-02-14 MED ORDER — HYDROMORPHONE HCL PF 1 MG/ML IJ SOLN
0.2500 mg | INTRAMUSCULAR | Status: DC | PRN
  Administered 2013-02-14 (×4): 0.5 mg via INTRAVENOUS

## 2013-02-14 MED ORDER — SODIUM CHLORIDE 0.9 % IR SOLN
Status: DC | PRN
  Administered 2013-02-14: 1000 mL

## 2013-02-14 MED ORDER — PHENYLEPHRINE 40 MCG/ML (10ML) SYRINGE FOR IV PUSH (FOR BLOOD PRESSURE SUPPORT)
PREFILLED_SYRINGE | INTRAVENOUS | Status: AC
  Filled 2013-02-14: qty 10

## 2013-02-14 MED ORDER — ONDANSETRON HCL 4 MG/2ML IJ SOLN
INTRAMUSCULAR | Status: DC | PRN
  Administered 2013-02-14: 4 mg via INTRAVENOUS

## 2013-02-14 MED ORDER — SODIUM CHLORIDE 0.9 % IV SOLN: INTRAVENOUS | Status: DC | PRN

## 2013-02-14 MED ORDER — HYDROMORPHONE HCL PF 1 MG/ML IJ SOLN
INTRAMUSCULAR | Status: AC
  Administered 2013-02-14: 0.5 mg via INTRAVENOUS
  Filled 2013-02-14: qty 1

## 2013-02-14 MED ORDER — LACTATED RINGERS IV SOLN
INTRAVENOUS | Status: DC
  Administered 2013-02-14: 07:00:00 via INTRAVENOUS

## 2013-02-14 MED ORDER — MIDAZOLAM HCL 2 MG/2ML IJ SOLN
INTRAMUSCULAR | Status: AC
  Filled 2013-02-14: qty 2

## 2013-02-14 MED ORDER — OXYCODONE HCL 5 MG PO TABS: 5.0000 mg | ORAL_TABLET | Freq: Once | ORAL | Status: DC | PRN

## 2013-02-14 MED ORDER — ONDANSETRON HCL 4 MG PO TABS: 4.0000 mg | ORAL_TABLET | Freq: Four times a day (QID) | ORAL | Status: DC | PRN

## 2013-02-14 MED ORDER — MEPERIDINE HCL 25 MG/ML IJ SOLN: 6.2500 mg | INTRAMUSCULAR | Status: DC | PRN

## 2013-02-14 MED ORDER — ROCURONIUM BROMIDE 50 MG/5ML IV SOLN
INTRAVENOUS | Status: AC
  Filled 2013-02-14: qty 1

## 2013-02-14 MED ORDER — FENTANYL CITRATE 0.05 MG/ML IJ SOLN
INTRAMUSCULAR | Status: AC
  Filled 2013-02-14: qty 5

## 2013-02-14 MED ORDER — 0.9 % SODIUM CHLORIDE (POUR BTL) OPTIME
TOPICAL | Status: DC | PRN
  Administered 2013-02-14: 1000 mL

## 2013-02-14 MED ORDER — ROCURONIUM BROMIDE 50 MG/5ML IV SOLN
INTRAVENOUS | Status: AC
Start: 2013-02-14 — End: 2013-02-14
  Filled 2013-02-14: qty 1

## 2013-02-14 MED ORDER — MIDAZOLAM HCL 2 MG/2ML IJ SOLN: 0.5000 mg | Freq: Once | INTRAMUSCULAR | Status: DC | PRN

## 2013-02-14 MED ORDER — FENTANYL CITRATE 0.05 MG/ML IJ SOLN
25.0000 ug | INTRAMUSCULAR | Status: DC | PRN
  Administered 2013-02-14 (×2): 50 ug via INTRAVENOUS
  Filled 2013-02-14 (×2): qty 2

## 2013-02-14 MED ORDER — PROMETHAZINE HCL 25 MG/ML IJ SOLN: 6.2500 mg | INTRAMUSCULAR | Status: DC | PRN

## 2013-02-14 MED ORDER — ONDANSETRON HCL 4 MG/2ML IJ SOLN
INTRAMUSCULAR | Status: AC
  Filled 2013-02-14: qty 2

## 2013-02-14 MED ORDER — ONDANSETRON HCL 4 MG/2ML IJ SOLN
4.0000 mg | Freq: Four times a day (QID) | INTRAMUSCULAR | Status: DC | PRN
Start: 2013-02-14 — End: 2013-02-15

## 2013-02-14 MED ORDER — LIDOCAINE HCL (CARDIAC) 20 MG/ML IV SOLN
INTRAVENOUS | Status: AC
  Filled 2013-02-14: qty 5

## 2013-02-14 MED ORDER — OXYCODONE HCL 5 MG/5ML PO SOLN: 5.0000 mg | Freq: Once | ORAL | Status: DC | PRN

## 2013-02-14 MED ORDER — NEOSTIGMINE METHYLSULFATE 1 MG/ML IJ SOLN
INTRAMUSCULAR | Status: DC | PRN
  Administered 2013-02-14: 5 mg via INTRAVENOUS

## 2013-02-14 MED ORDER — PROPOFOL 10 MG/ML IV BOLUS
INTRAVENOUS | Status: DC | PRN
  Administered 2013-02-14: 120 mg via INTRAVENOUS

## 2013-02-14 MED ORDER — ENOXAPARIN SODIUM 40 MG/0.4ML ~~LOC~~ SOLN
40.0000 mg | SUBCUTANEOUS | Status: DC
  Administered 2013-02-14: 40 mg via SUBCUTANEOUS
  Filled 2013-02-14 (×2): qty 0.4

## 2013-02-14 MED ORDER — FENTANYL CITRATE 0.05 MG/ML IJ SOLN
INTRAMUSCULAR | Status: DC | PRN
  Administered 2013-02-14: 150 ug via INTRAVENOUS
  Administered 2013-02-14 (×2): 50 ug via INTRAVENOUS

## 2013-02-14 MED ORDER — LACTATED RINGERS IV SOLN
INTRAVENOUS | Status: DC | PRN
  Administered 2013-02-14 (×2): via INTRAVENOUS

## 2013-02-14 MED ORDER — GLYCOPYRROLATE 0.2 MG/ML IJ SOLN
INTRAMUSCULAR | Status: DC | PRN
  Administered 2013-02-14: .8 mg via INTRAVENOUS

## 2013-02-14 MED ORDER — BUPIVACAINE-EPINEPHRINE 0.25% -1:200000 IJ SOLN
INTRAMUSCULAR | Status: DC | PRN
  Administered 2013-02-14: 20 mL

## 2013-02-14 MED ORDER — GLYCOPYRROLATE 0.2 MG/ML IJ SOLN
INTRAMUSCULAR | Status: AC
  Filled 2013-02-14: qty 4

## 2013-02-14 MED ORDER — HYDROCODONE-ACETAMINOPHEN 5-325 MG PO TABS
1.0000 | ORAL_TABLET | ORAL | Status: DC | PRN
  Administered 2013-02-14 – 2013-02-15 (×4): 2 via ORAL
  Filled 2013-02-14 (×4): qty 2

## 2013-02-14 MED ORDER — POTASSIUM CHLORIDE IN NACL 20-0.9 MEQ/L-% IV SOLN
INTRAVENOUS | Status: DC
  Administered 2013-02-14 (×2): via INTRAVENOUS
  Filled 2013-02-14 (×5): qty 1000

## 2013-02-14 MED ORDER — PROPOFOL 10 MG/ML IV BOLUS
INTRAVENOUS | Status: AC
  Filled 2013-02-14: qty 20

## 2013-02-14 SURGICAL SUPPLY — 40 items
APPLIER CLIP ROT 10 11.4 M/L (STAPLE) ×3
BLADE SURG ROTATE 9660 (MISCELLANEOUS) IMPLANT
CANISTER SUCTION 2500CC (MISCELLANEOUS) ×3 IMPLANT
CHLORAPREP W/TINT 26ML (MISCELLANEOUS) ×3 IMPLANT
CLIP APPLIE ROT 10 11.4 M/L (STAPLE) ×2 IMPLANT
COVER MAYO STAND STRL (DRAPES) ×3 IMPLANT
COVER SURGICAL LIGHT HANDLE (MISCELLANEOUS) ×3 IMPLANT
DECANTER SPIKE VIAL GLASS SM (MISCELLANEOUS) ×6 IMPLANT
DERMABOND ADVANCED (GAUZE/BANDAGES/DRESSINGS) ×1
DERMABOND ADVANCED .7 DNX12 (GAUZE/BANDAGES/DRESSINGS) ×2 IMPLANT
DRAIN CHANNEL 19F RND (DRAIN) ×3 IMPLANT
DRAPE C-ARM 42X72 X-RAY (DRAPES) ×3 IMPLANT
DRAPE UTILITY 15X26 W/TAPE STR (DRAPE) ×6 IMPLANT
ELECT REM PT RETURN 9FT ADLT (ELECTROSURGICAL) ×3
ELECTRODE REM PT RTRN 9FT ADLT (ELECTROSURGICAL) ×2 IMPLANT
EVACUATOR SILICONE 100CC (DRAIN) ×3 IMPLANT
GAUZE SPONGE 2X2 8PLY STRL LF (GAUZE/BANDAGES/DRESSINGS) ×2 IMPLANT
GLOVE EUDERMIC 7 POWDERFREE (GLOVE) ×3 IMPLANT
GOWN STRL NON-REIN LRG LVL3 (GOWN DISPOSABLE) ×9 IMPLANT
GOWN STRL REIN XL XLG (GOWN DISPOSABLE) ×3 IMPLANT
KIT BASIN OR (CUSTOM PROCEDURE TRAY) ×3 IMPLANT
KIT ROOM TURNOVER OR (KITS) ×3 IMPLANT
NS IRRIG 1000ML POUR BTL (IV SOLUTION) ×3 IMPLANT
PAD ARMBOARD 7.5X6 YLW CONV (MISCELLANEOUS) ×3 IMPLANT
POUCH SPECIMEN RETRIEVAL 10MM (ENDOMECHANICALS) ×3 IMPLANT
SCISSORS LAP 5X35 DISP (ENDOMECHANICALS) ×3 IMPLANT
SET CHOLANGIOGRAPH 5 50 .035 (SET/KITS/TRAYS/PACK) ×3 IMPLANT
SET IRRIG TUBING LAPAROSCOPIC (IRRIGATION / IRRIGATOR) ×3 IMPLANT
SLEEVE ENDOPATH XCEL 5M (ENDOMECHANICALS) ×3 IMPLANT
SPECIMEN JAR SMALL (MISCELLANEOUS) ×3 IMPLANT
SPONGE GAUZE 2X2 STER 10/PKG (GAUZE/BANDAGES/DRESSINGS) ×1
SUT ETHILON 3 0 PS 1 (SUTURE) ×3 IMPLANT
SUT MNCRL AB 4-0 PS2 18 (SUTURE) ×3 IMPLANT
TAPE CLOTH SURG 4X10 WHT LF (GAUZE/BANDAGES/DRESSINGS) ×3 IMPLANT
TOWEL OR 17X24 6PK STRL BLUE (TOWEL DISPOSABLE) ×3 IMPLANT
TOWEL OR 17X26 10 PK STRL BLUE (TOWEL DISPOSABLE) ×3 IMPLANT
TRAY LAPAROSCOPIC (CUSTOM PROCEDURE TRAY) ×3 IMPLANT
TROCAR XCEL BLUNT TIP 100MML (ENDOMECHANICALS) ×3 IMPLANT
TROCAR XCEL NON-BLD 11X100MML (ENDOMECHANICALS) ×3 IMPLANT
TROCAR XCEL NON-BLD 5MMX100MML (ENDOMECHANICALS) ×3 IMPLANT

## 2013-02-14 NOTE — Progress Notes (Addendum)
After prep patient reported itching at area between rib cage and abd. Area noted to be slightly pink when compared to other skin. Washed area with soap and water. Patient reports relief. OR staff notified

## 2013-02-14 NOTE — Transfer of Care (Signed)
Immediate Anesthesia Transfer of Care Note  Patient: Marc Brown  Procedure(s) Performed: Procedure(s): LAPAROSCOPIC CHOLECYSTECTOMY (N/A)  Patient Location: PACU  Anesthesia Type:General  Level of Consciousness: awake, alert  and oriented  Airway & Oxygen Therapy: Patient Spontanous Breathing and Patient connected to nasal cannula oxygen  Post-op Assessment: Report given to PACU RN, Post -op Vital signs reviewed and stable and Patient moving all extremities X 4  Post vital signs: Reviewed and stable  Complications: No apparent anesthesia complications

## 2013-02-14 NOTE — Anesthesia Preprocedure Evaluation (Addendum)
Anesthesia Evaluation  Patient identified by MRN, date of birth, ID band Patient awake    Reviewed: Allergy & Precautions, H&P , NPO status , Patient's Chart, lab work & pertinent test results, reviewed documented beta blocker date and time   Airway Mallampati: II TM Distance: >3 FB Neck ROM: Full    Dental  (+) Teeth Intact, Dental Advisory Given   Pulmonary Current Smoker,          Cardiovascular Rhythm:Regular     Neuro/Psych    GI/Hepatic GERD-  Medicated,  Endo/Other  Morbid obesity  Renal/GU      Musculoskeletal   Abdominal   Peds  Hematology   Anesthesia Other Findings   Reproductive/Obstetrics                          Anesthesia Physical Anesthesia Plan  ASA: II  Anesthesia Plan: General   Post-op Pain Management:    Induction: Intravenous  Airway Management Planned: Oral ETT  Additional Equipment:   Intra-op Plan:   Post-operative Plan: Extubation in OR  Informed Consent: I have reviewed the patients History and Physical, chart, labs and discussed the procedure including the risks, benefits and alternatives for the proposed anesthesia with the patient or authorized representative who has indicated his/her understanding and acceptance.   Dental advisory given  Plan Discussed with: CRNA and Anesthesiologist  Anesthesia Plan Comments:         Anesthesia Quick Evaluation

## 2013-02-14 NOTE — Op Note (Signed)
Patient Name:           Marc Brown   Date of Surgery:        02/14/2013  Pre op Diagnosis:      Acute and chronic cholecystitis with cholelithiasis  Post op Diagnosis:    Same  Procedure:                 Laparoscopic cholecystectomy  Surgeon:                     Angelia Mould. Derrell Lolling, M.D., FACS  Assistant:                      Gaynelle Adu, M.D., FACS  Operative Indications:   BIAGIO Brown is a 53 y.o. male. He is referred by Dr. Alonza Smoker evaluation and management of symptomatic gallstones.  Last weekend he had the onset of epigastric pain that was severe, lasted 8 hours. This was after eating chicken potpie. He was nauseated but did not vomit. When the pain persisted and migrated to the right upper quadrant he went to the emergency department. All of his lab work was normal. Ultrasound shows numerous gallstones, one very large stone in the neck of the gallbladder. Symptoms subsided. He saw Dr. Tawanna Cooler and was advised to have cholecystectomy. That evening he had a second attack which then has resolved. He is afraid to eat and is fearful that he'll have another attack.  He wants to get something done because he's going [facility] March 1. I saw him in the office 2 days ago and scheduled him for surgery today.  Comorbidities include daily tobacco use. No other significant comorbidities.   Operative Findings:       The gallbladder was acutely and chronically inflamed. Dissection was difficult. There were also omental adhesions to the gallbladder. The lower gallbladder was actually necrotic and I had to do a partial cholecystectomy leaving a little bit of infundibulum. We spilled a few stones but this was controlled and we were able to retrieve all these and we do not feel that we left any stones behind. We were unable to do a cholangiogram because of the intense inflammation down near the neck of the gallbladder. The liver appeared healthy. The stomach, duodenum, small bowel, and large  bowel were grossly normal. We left a drain.  Procedure in Detail:          Following the induction of general endotracheal anesthesia the patient's abdomen was prepped and draped in a sterile fashion. Intravenous antibiotics were given. Surgical time out was performed. 0.5% Marcaine with epinephrine was used for local infiltration anesthetic. An 11 mm Hassan trocar was inserted in the midline just above the umbilicus with an open technique and secured with a purse string suture of 0 Vicryl. Pneumoperitoneum was created and a video camera inserted. 11 mm trocar placed in the subxiphoid region and two 5 mm trocars placed in the right upper quadrant.    We were able to grasp the tip of the fundus of the gallbladder and then slowly take the adhesions down. This took some time as there were dense and acutely inflamed. As we got down to the lower gallbladder the wall was very thin and necrotic and I made two small holes in the gallbladder in the course of the dissection.       . A few small yellow stones and one large stone were retrieved. The dissection  was taken down to the infundibulum and because of the necrotic nature the tissue we elected just to dissect around behind the wall of the gallbladder and amputate the gallbladder at the level of the distal infundibulum, controlling the stump of the infundibulum with multiple clips. The cystic artery was controlled as it went up the left side of the wall the gallbladder, clipped with metal clips and divided. The gallbladder was then further dissected from its bed placed in a specimen bag and removed. The operative field was copiously irrigated and copiously cauterized. The did not seem to be a significant active bleeding at the bed of the gallbladder but the bed was quite raw. We did not see any bile leak but I felt that there was risk because of the necrotic infundibulum and so I placed a 19 [place] Blake drain under the liver and brought it out through one of the  trocar sites in the right upper quadrant. The trocars were removed and the pneumoperitoneum was released. The fascia at the umbilicus closed with 0 Vicryl sutures. Skin incisions were closed with subcuticular sutures of 4-0 Monocryl and Dermabond. Patient taken to recovery in stable condition. EBL 25 cc. Counts correct. Complications none.     Angelia MouldHaywood M. Derrell LollingIngram, M.D., FACS General and Minimally Invasive Surgery Breast and Colorectal Surgery  02/14/2013 9:32 AM

## 2013-02-14 NOTE — Anesthesia Postprocedure Evaluation (Signed)
  Anesthesia Post-op Note  Patient: Marc Brown  Procedure(s) Performed: Procedure(s): LAPAROSCOPIC CHOLECYSTECTOMY (N/A)  Patient Location: PACU  Anesthesia Type:General  Level of Consciousness: awake, alert , oriented and patient cooperative  Airway and Oxygen Therapy: Patient Spontanous Breathing  Post-op Pain: mild  Post-op Assessment: Post-op Vital signs reviewed, Patient's Cardiovascular Status Stable, Respiratory Function Stable, Patent Airway, No signs of Nausea or vomiting and Pain level controlled  Post-op Vital Signs: Reviewed and stable  Complications: No apparent anesthesia complications

## 2013-02-14 NOTE — Preoperative (Signed)
Beta Blockers   Reason not to administer Beta Blockers:Not Applicable 

## 2013-02-14 NOTE — Anesthesia Procedure Notes (Signed)
Procedure Name: Intubation Date/Time: 02/14/2013 7:59 AM Performed by: Reine JustFLOWERS, Jasean Ambrosia T Pre-anesthesia Checklist: Patient identified, Timeout performed, Emergency Drugs available, Suction available and Patient being monitored Patient Re-evaluated:Patient Re-evaluated prior to inductionOxygen Delivery Method: Circle system utilized and Simple face mask Preoxygenation: Pre-oxygenation with 100% oxygen Intubation Type: IV induction Ventilation: Two handed mask ventilation required and Mask ventilation with difficulty Laryngoscope Size: Miller and 3 Grade View: Grade I Tube type: Oral Tube size: 7.5 mm Number of attempts: 1 Airway Equipment and Method: Patient positioned with wedge pillow and Stylet Placement Confirmation: ETT inserted through vocal cords under direct vision,  positive ETCO2 and breath sounds checked- equal and bilateral Secured at: 22 cm Tube secured with: Tape Dental Injury: Teeth and Oropharynx as per pre-operative assessment

## 2013-02-14 NOTE — Interval H&P Note (Signed)
History and Physical Interval Note:  02/14/2013 6:36 AM  Marc Brown  has presented today for surgery, with the diagnosis of gallstones  The goals and the various methods of treatment have been discussed with the patient and family. After consideration of risks, benefits and other options for treatment, the patient has consented to  Procedure(s): LAPAROSCOPIC CHOLECYSTECTOMY WITH INTRAOPERATIVE CHOLANGIOGRAM (N/A) as a surgical intervention .  The patient's history has been reviewed, patient examined today, no change in status, stable for surgery.  I have reviewed the patient's chart and labs.  Questions were answered to the patient's satisfaction.     Ernestene MentionINGRAM,[name] M

## 2013-02-15 LAB — COMPREHENSIVE METABOLIC PANEL
ALBUMIN: 2.9 g/dL — AB (ref 3.5–5.2)
ALT: 44 U/L (ref 0–53)
AST: 30 U/L (ref 0–37)
Alkaline Phosphatase: 71 U/L (ref 39–117)
BUN: 10 mg/dL (ref 6–23)
CALCIUM: 8.1 mg/dL — AB (ref 8.4–10.5)
CO2: 23 mEq/L (ref 19–32)
CREATININE: 0.83 mg/dL (ref 0.50–1.35)
Chloride: 101 mEq/L (ref 96–112)
GFR calc Af Amer: >90 mL/min (ref >90)
GFR calc non Af Amer: >90 mL/min (ref >90)
Glucose, Bld: 101 mg/dL — ABNORMAL HIGH (ref 70–99)
Potassium: 4.1 mEq/L (ref 3.7–5.3)
Sodium: 136 mEq/L — ABNORMAL LOW (ref 137–147)
TOTAL PROTEIN: 6.1 g/dL (ref 6.0–8.3)
Total Bilirubin: 0.4 mg/dL (ref 0.3–1.2)

## 2013-02-15 LAB — CBC
HEMATOCRIT: 39.4 % (ref 39.0–52.0)
HEMOGLOBIN: 14.1 g/dL (ref 13.0–17.0)
MCH: 31.4 pg (ref 26.0–34.0)
MCHC: 35.8 g/dL (ref 30.0–36.0)
MCV: 87.8 fL (ref 78.0–100.0)
Platelets: 229 10*3/uL (ref 150–400)
RBC: 4.49 MIL/uL (ref 4.22–5.81)
RDW: 12.8 % (ref 11.5–15.5)
WBC: 9.1 10*3/uL (ref 4.0–10.5)

## 2013-02-15 MED ORDER — HYDROCODONE-ACETAMINOPHEN 5-325 MG PO TABS: 1.0000 | ORAL_TABLET | ORAL | Status: DC | PRN

## 2013-02-15 NOTE — Discharge Instructions (Signed)
-  see above 

## 2013-02-15 NOTE — Discharge Summary (Signed)
Patient ID: Marc Brown [id number] 52 y.o. [birth year]  Admit date: 02/14/2013  Discharge date and time: 02/15/2013   Admitting Physician: Ernestene Mention  Discharge Physician: Ernestene Mention  Admission Diagnoses: gallstones  Discharge Diagnoses: Acute and chronic cholecystitis with cholelithiasis. Daily tobacco use   Operations: Procedure(s): LAPAROSCOPIC CHOLECYSTECTOMY  Admission Condition: good  Discharged Condition: good  Indication for Admission: Marc Brown is a 53 y.o. male. He is referred by Dr. Alonza Smoker for evaluation and management of symptomatic gallstones.  Last weekend he had the onset of epigastric pain that was severe, lasted 8 hours. This was after eating chicken potpie. He was nauseated but did not vomit. When the pain persisted and migrated to the right upper quadrant he went to the emergency department. All of his lab work was normal. Ultrasound shows numerous gallstones, one very large stone in the neck of the gallbladder. Symptoms subsided. He saw Dr. Tawanna Cooler and was advised to have cholecystectomy. That evening he had a second attack which then has resolved. He is afraid to eat and is fearful that he'll have another attack.  . I saw him in the office 3 days ago and scheduled him for surgery. Comorbidities include daily tobacco use. No other significant comorbidities.   Hospital Course: On the day of admission the patient was taken to the operating room and underwent laparoscopic cholecystectomy.The gallbladder was acutely and chronically inflamed. Dissection was difficult. There were also omental adhesions to the gallbladder. The lower gallbladder was actually necrotic and I had to do a partial cholecystectomy leaving a little bit of infundibulum. We spilled a few stones but this was controlled and we were able to retrieve all these and we do not feel that we left any stones behind. We were unable to do a cholangiogram because of the intense  inflammation down near the neck of the gallbladder. The liver appeared healthy. The stomach, duodenum, small bowel, and large bowel were grossly normal. We left a drain.    We decided to hospitalize him overnight for observation, and he did very well. He progressed in his diet and activities without any difficulty. On the day of discharge he was comfortable, tolerating a regular diet, voiding without difficulty, and ambulating independently. His abdomen was soft. His wounds looked good. The drainage was serosanguineous.Lab work on the day of discharge showed normal liver function tests, hemoglobin 14.1, WBC 9100.    He was advised in diet activities. He was given a prescription for Norco for pain. He will return to see me later this coming week for drain removal.   Consults: None  Significant Diagnostic Studies: Laboratory evaluation. Surgical pathology which is pending.  Treatments: surgery: Laparoscopic cholecystectomy  Disposition: Home  Patient Instructions:    Medication List         amitriptyline 25 MG tablet  Commonly known as:  ELAVIL  Take 25 mg by mouth at bedtime.     aspirin 81 MG tablet  Take 81 mg by mouth at bedtime.     HYDROcodone-acetaminophen 5-325 MG per tablet  Commonly known as:  NORCO/VICODIN  Take 1-2 tablets by mouth every 4 (four) hours as needed.     ibuprofen 200 MG tablet  Commonly known as:  ADVIL,MOTRIN  Take 400 mg by mouth every 6 (six) hours as needed for moderate pain.     ondansetron 4 MG disintegrating tablet  Commonly known as:  ZOFRAN-ODT  Take 4 mg by mouth every 8 (eight) hours as needed for nausea or  vomiting.     oxyCODONE-acetaminophen 5-325 MG per tablet  Commonly known as:  PERCOCET/ROXICET  Take 1-2 tablets by mouth every 6 (six) hours as needed for severe pain.     varenicline 1 MG tablet  Commonly known as:  CHANTIX  Take 0.5 mg by mouth daily.        Activity: activity as tolerated Diet: low fat, low cholesterol  diet Wound Care: ddetailed instructions given on bathing and drain care.   Follow-up:  With Dr. Derrell LollingIngram in 1 week.  Signed: Angelia MouldHaywood M. Derrell LollingIngram, M.D., FACS General and minimally invasive surgery Breast and Colorectal Surgery  02/15/2013, 8:39 AM

## 2013-02-17 ENCOUNTER — Encounter (HOSPITAL_COMMUNITY): Payer: Self-pay | Admitting: General Surgery

## 2013-02-20 ENCOUNTER — Ambulatory Visit (INDEPENDENT_AMBULATORY_CARE_PROVIDER_SITE_OTHER): Payer: BC Managed Care – PPO | Admitting: General Surgery

## 2013-02-20 ENCOUNTER — Encounter (INDEPENDENT_AMBULATORY_CARE_PROVIDER_SITE_OTHER): Payer: Self-pay | Admitting: General Surgery

## 2013-02-20 VITALS — BP 118/72 | HR 68 | Temp 98.5°F | Resp 15 | Ht 72.0 in | Wt 238.2 lb

## 2013-02-20 DIAGNOSIS — K8000 Calculus of gallbladder with acute cholecystitis without obstruction: Secondary | ICD-10-CM

## 2013-02-20 NOTE — Patient Instructions (Signed)
Your drain is draining bile so you have a bile leak. I suspected this would happen. You are otherwise doing well.  I would anticipate that the bile leak will seal itself in 2 or 3 weeks. Because of this we will be to leave the drain in place.  Continue to keep a record of the drainage.  Return to see Dr. Derrell LollingIngram in about 2 weeks or so. This can be scheduled after [place].

## 2013-02-20 NOTE — Progress Notes (Signed)
Patient ID: Marc Brown, male   DOB: [birth year], 53 y.o.   MRN: [id number] History: This gentleman underwent laparoscopic cholecystectomy on February 14. He had acute and chronic cholecystitis with cholelithiasis. His gallbladder was necrotic. I felt that I did a subtotal cholecystectomy and  placed clips on the distal infundibulum and left a drain. The following morning he was well and liver function tests were normal and drainage was serosanguineous. Since discharge he has done well. He gets a little bit of gas cramps appetite is normal and bowel movements are normal. No fevers. He is draining anywhere from 100 - 165 cc of fluid per day from the drain and it looks bilious  Exam: Patient looks well. No distress Sclera are clear Abdomen is soft, scaphoid, nontender. The drainage is bilious and clear.  Assessment: Acute and chronic cholecystitis with cholelithiasis, status post urgent laparoscopic cholecystectomy Controlled bile leak, suspect this is from cystic duct stump. Hopefully this will be self-limited  Plan: Hydration stressed to the fat diet Continued record keeping of the drain Return to see me in 2 weeks, sooner if the drainage stops. I did tell him that if he continued to have a bile leak after about 3 weeks we might refer him for ERCP with stent placement.   Angelia MouldHaywood M. Derrell LollingIngram, M.D., [facility] General and Minimally invasive Surgery Breast and Colorectal Surgery Office:   [phone] Pager:   [phone]

## 2013-02-24 ENCOUNTER — Telehealth (INDEPENDENT_AMBULATORY_CARE_PROVIDER_SITE_OTHER): Payer: Self-pay | Admitting: General Surgery

## 2013-02-24 NOTE — Telephone Encounter (Signed)
Pt called to clarify removal of drain (JP.)  He has had color change to "light strawberry" and the amount is less than 10cc for yesterday and is scant today.  He will call back on Thursday to schedule nurse only visit.

## 2013-02-27 ENCOUNTER — Ambulatory Visit (INDEPENDENT_AMBULATORY_CARE_PROVIDER_SITE_OTHER): Payer: BC Managed Care – PPO

## 2013-02-27 NOTE — Progress Notes (Unsigned)
Patient in for nurse only drain removal; Afebrile,20cc yellowish fluid noted  Cleansed  Area with Chorea prep , removed suture, removed drain. Patient tolerated well, advised patient to call if condition changes ie temp 100.3 +, redness or odor from incision  Site. Patient verbalized understanding  Patient is aware to return for P/o appt with DR. Derrell Lollingngram 03-12-13 1230p

## 2013-03-10 ENCOUNTER — Encounter (INDEPENDENT_AMBULATORY_CARE_PROVIDER_SITE_OTHER): Payer: BC Managed Care – PPO | Admitting: General Surgery

## 2013-03-12 ENCOUNTER — Encounter (INDEPENDENT_AMBULATORY_CARE_PROVIDER_SITE_OTHER): Payer: Self-pay | Admitting: General Surgery

## 2013-03-12 ENCOUNTER — Ambulatory Visit (INDEPENDENT_AMBULATORY_CARE_PROVIDER_SITE_OTHER): Payer: BC Managed Care – PPO | Admitting: General Surgery

## 2013-03-12 VITALS — BP 112/80 | HR 84 | Temp 98.0°F | Resp 14 | Ht 72.0 in | Wt 241.6 lb

## 2013-03-12 DIAGNOSIS — K8000 Calculus of gallbladder with acute cholecystitis without obstruction: Secondary | ICD-10-CM

## 2013-03-12 DIAGNOSIS — K929 Disease of digestive system, unspecified: Secondary | ICD-10-CM

## 2013-03-12 DIAGNOSIS — K838 Other specified diseases of biliary tract: Secondary | ICD-10-CM

## 2013-03-12 DIAGNOSIS — K9189 Other postprocedural complications and disorders of digestive system: Secondary | ICD-10-CM

## 2013-03-12 NOTE — Patient Instructions (Signed)
You are doing well. There is no evidence of any complications of gallbladder surgery.  You may resume normal physical activities without restriction.  Return to see Dr. Derrell LollingIngram if necessary.

## 2013-03-12 NOTE — Progress Notes (Signed)
Patient ID: Marc Brown, male   DOB: 10-09-1960, 53 y.o.   MRN: [id number]  History:  This gentleman underwent laparoscopic cholecystectomy on February 14. He had acute and chronic cholecystitis with cholelithiasis. His gallbladder was necrotic. I felt that I did a subtotal cholecystectomy and placed clips on the distal infundibulum and left a drain. The following morning he was well and liver function tests were normal and drainage was serosanguineous.  Since discharge he has done well. The last time I saw him he was draining over 100 cc a day of bilious fluid but was otherwise doing well. The drainage stopped and it turned a serosanguineous. He called and while the nurses to his drain out in the interim. He returns for followup with me. He is doing very well. Has no complaints. [place]. Normal bowel function. No abdominal pain.  Exam:  Patient looks well. No distress  Sclera are clear  Abdomen is soft, scaphoid, nontender. .   Assessment:  Acute and chronic cholecystitis with cholelithiasis, status post urgent laparoscopic cholecystectomy  Controlled bile leak, suspect this is from cystic duct stump. This is resolved the drain is out.  Plan:  Diet and activities discussed. Return to see me if necessary.       Angelia MouldHaywood M. Derrell LollingIngram, M.D., [facility]  [facility]  General and Minimally invasive Surgery  Breast and Colorectal Surgery  Office: [phone]  Pager: [phone]

## 2014-04-01 ENCOUNTER — Other Ambulatory Visit: Payer: Self-pay | Admitting: Family Medicine

## 2014-07-11 ENCOUNTER — Other Ambulatory Visit: Payer: Self-pay | Admitting: Family Medicine

## 2014-07-15 ENCOUNTER — Encounter: Payer: Self-pay | Admitting: Adult Health

## 2014-07-15 ENCOUNTER — Ambulatory Visit (INDEPENDENT_AMBULATORY_CARE_PROVIDER_SITE_OTHER): Payer: BC Managed Care – PPO | Admitting: Adult Health

## 2014-07-15 VITALS — BP 108/78 | Temp 98.2°F | Ht 72.0 in | Wt 253.0 lb

## 2014-07-15 DIAGNOSIS — Z760 Encounter for issue of repeat prescription: Secondary | ICD-10-CM | POA: Diagnosis not present

## 2014-07-15 DIAGNOSIS — H9209 Otalgia, unspecified ear: Secondary | ICD-10-CM

## 2014-07-15 MED ORDER — AMITRIPTYLINE HCL 25 MG PO TABS: 25.0000 mg | ORAL_TABLET | Freq: Every day | ORAL | Status: DC

## 2014-07-15 NOTE — Progress Notes (Signed)
Pre visit review using our clinic review tool, if applicable. No additional management support is needed unless otherwise documented below in the visit note. 

## 2014-07-15 NOTE — Progress Notes (Signed)
   Subjective:    Patient ID: Marc Brown, male    DOB: 1960/02/12, 54 y.o.   MRN: [id number]  HPI  54 year old male who presents to the office today for medication refill on his Amitriptyline. He takes it for ear pain from clinching his teeth at night.   He has no other complaints at this time.  Review of Systems  HENT: Negative for ear pain.   Neurological: Negative.   Psychiatric/Behavioral: Negative.   All other systems reviewed and are negative.  Past Medical History  Diagnosis Date  . Hyperlipidemia   . Obese   . Tobacco abuse   . ED (erectile dysfunction)   . Recurrent boils     History   Social History  . Marital Status: Single    Spouse Name: N/A  . Number of Children: N/A  . Years of Education: N/A   Occupational History  . Not on file.   Social History Main Topics  . Smoking status: Current Every Day Smoker -- 1.00 packs/day for 20 years    Types: Cigarettes  . Smokeless tobacco: Never Used  . Alcohol Use: 4.8 oz/week    8 Glasses of wine per week  . Drug Use: No  . Sexual Activity: Not on file   Other Topics Concern  . Not on file   Social History Narrative    Past Surgical History  Procedure Laterality Date  . Elbow fracture surgery Left     left  . Cholecystectomy N/A 02/14/2013    Procedure: LAPAROSCOPIC CHOLECYSTECTOMY;  Surgeon: Ernestene MentionHaywood M Ingram, MD;  [facility];  Service: General;  Laterality: N/A;    Family History  Problem Relation Age of Onset  . Heart disease Father   . Obesity Father   . Schizophrenia Father   . Kidney disease Mother   . Hearing loss Mother     due to whooping cough  . Multiple sclerosis Brother   . Hyperlipidemia Brother     No Known Allergies  Current Outpatient Prescriptions on File Prior to Visit  Medication Sig Dispense Refill  . aspirin 81 MG tablet Take 81 mg by mouth at bedtime.     . varenicline (CHANTIX) 1 MG tablet Take 0.5 mg by mouth daily.     No current facility-administered  medications on file prior to visit.    BP 108/78 mmHg  Temp(Src) 98.2 F (36.8 C) (Oral)  Ht 6' (1.829 m)  Wt 253 lb (114.76 kg)  BMI 34.31 kg/m2       Objective:   Physical Exam  Constitutional: He appears well-developed and well-nourished. No distress.  Cardiovascular: Normal rate, regular rhythm, normal heart sounds and intact distal pulses.  Exam reveals no gallop and no friction rub.   No murmur heard. Pulmonary/Chest: Effort normal and breath sounds normal. No respiratory distress. He has no wheezes. He has no rales. He exhibits no tenderness.  Neurological: He is alert.  Skin: Skin is warm and dry. He is not diaphoretic.  Psychiatric: He has a normal mood and affect. His behavior is normal. Judgment and thought content normal.      Assessment & Plan:  1. Medication refill - amitriptyline (ELAVIL) 25 MG tablet; Take 1 tablet (25 mg total) by mouth at bedtime.  Dispense: 90 tablet; Refill: 2 - Follow up for CPE - Follow up as needed

## 2014-07-15 NOTE — Patient Instructions (Signed)
It was great meeting you today!   I have sent the amitriptyline to the pharmacy.   Schedule your physical with either I or Dr. Tawanna Coolerodd.   If you need anything in the mean time, please do not hesitate to call.

## 2014-08-18 ENCOUNTER — Other Ambulatory Visit (INDEPENDENT_AMBULATORY_CARE_PROVIDER_SITE_OTHER): Payer: BC Managed Care – PPO

## 2014-08-18 DIAGNOSIS — Z0000 Encounter for general adult medical examination without abnormal findings: Secondary | ICD-10-CM

## 2014-08-18 LAB — POCT URINALYSIS DIPSTICK
Bilirubin, UA: NEGATIVE
Glucose, UA: NEGATIVE
Ketones, UA: NEGATIVE
Leukocytes, UA: NEGATIVE
NITRITE UA: NEGATIVE
Protein, UA: NEGATIVE
SPEC GRAV UA: 1.025
Urobilinogen, UA: 0.2
pH, UA: 5

## 2014-08-18 LAB — LIPID PANEL
Cholesterol: 162 mg/dL (ref 0–200)
HDL: 31.9 mg/dL — ABNORMAL LOW (ref >39.00)
LDL Cholesterol: 112 mg/dL — ABNORMAL HIGH (ref 0–99)
NonHDL: 130.23
TRIGLYCERIDES: 93 mg/dL (ref 0.0–149.0)
Total CHOL/HDL Ratio: 5
VLDL: 18.6 mg/dL (ref 0.0–40.0)

## 2014-08-18 LAB — HEPATIC FUNCTION PANEL
ALBUMIN: 4.1 g/dL (ref 3.5–5.2)
ALT: 19 U/L (ref 0–53)
AST: 17 U/L (ref 0–37)
Alkaline Phosphatase: 58 U/L (ref 39–117)
BILIRUBIN DIRECT: 0.1 mg/dL (ref 0.0–0.3)
Total Bilirubin: 0.5 mg/dL (ref 0.2–1.2)
Total Protein: 6.4 g/dL (ref 6.0–8.3)

## 2014-08-18 LAB — BASIC METABOLIC PANEL
BUN: 15 mg/dL (ref 6–23)
CALCIUM: 9.1 mg/dL (ref 8.4–10.5)
CO2: 28 mEq/L (ref 19–32)
CREATININE: 0.94 mg/dL (ref 0.40–1.50)
Chloride: 106 mEq/L (ref 96–112)
GFR: 88.83 mL/min (ref >60.00)
GLUCOSE: 104 mg/dL — AB (ref 70–99)
Potassium: 5.5 mEq/L — ABNORMAL HIGH (ref 3.5–5.1)
Sodium: 139 mEq/L (ref 135–145)

## 2014-08-18 LAB — CBC WITH DIFFERENTIAL/PLATELET
BASOS ABS: 0 10*3/uL (ref 0.0–0.1)
Basophils Relative: 0.3 % (ref 0.0–3.0)
EOS ABS: 0.2 10*3/uL (ref 0.0–0.7)
Eosinophils Relative: 2.9 % (ref 0.0–5.0)
HCT: 49 % (ref 39.0–52.0)
Hemoglobin: 16.8 g/dL (ref 13.0–17.0)
Lymphocytes Relative: 31.7 % (ref 12.0–46.0)
Lymphs Abs: 2 10*3/uL (ref 0.7–4.0)
MCHC: 34.3 g/dL (ref 30.0–36.0)
MCV: 89.8 fl (ref 78.0–100.0)
MONO ABS: 0.5 10*3/uL (ref 0.1–1.0)
Monocytes Relative: 7.5 % (ref 3.0–12.0)
NEUTROS ABS: 3.7 10*3/uL (ref 1.4–7.7)
Neutrophils Relative %: 57.6 % (ref 43.0–77.0)
PLATELETS: 237 10*3/uL (ref 150.0–400.0)
RBC: 5.46 Mil/uL (ref 4.22–5.81)
RDW: 13.1 % (ref 11.5–15.5)
WBC: 6.4 10*3/uL (ref 4.0–10.5)

## 2014-08-18 LAB — TSH: TSH: 2 u[IU]/mL (ref 0.35–4.50)

## 2014-08-18 LAB — PSA: PSA: 0.76 ng/mL (ref 0.10–4.00)

## 2014-08-19 ENCOUNTER — Other Ambulatory Visit: Payer: BC Managed Care – PPO

## 2014-08-23 ENCOUNTER — Encounter: Payer: Self-pay | Admitting: Adult Health

## 2014-08-23 ENCOUNTER — Encounter: Payer: Self-pay | Admitting: Internal Medicine

## 2014-08-23 ENCOUNTER — Ambulatory Visit (INDEPENDENT_AMBULATORY_CARE_PROVIDER_SITE_OTHER): Payer: BC Managed Care – PPO | Admitting: Adult Health

## 2014-08-23 VITALS — BP 110/78 | Temp 98.3°F | Ht 72.0 in | Wt 256.6 lb

## 2014-08-23 DIAGNOSIS — R350 Frequency of micturition: Secondary | ICD-10-CM | POA: Diagnosis not present

## 2014-08-23 DIAGNOSIS — E785 Hyperlipidemia, unspecified: Secondary | ICD-10-CM

## 2014-08-23 DIAGNOSIS — Z0000 Encounter for general adult medical examination without abnormal findings: Secondary | ICD-10-CM

## 2014-08-23 NOTE — Progress Notes (Signed)
Pre visit review using our clinic review tool, if applicable. No additional management support is needed unless otherwise documented below in the visit note. 

## 2014-08-23 NOTE — Progress Notes (Signed)
Subjective:    Patient ID: Marc Brown, male    DOB: April 18, 1960, 54 y.o.   MRN: [id number]  HPI  54 year old male, patient of Dr. Tawanna Cooler, who presents to the office today for complete physical, he has a  has a past medical history of Hyperlipidemia; Obese; Tobacco abuse; ED (erectile dysfunction); and Recurrent boils.   He continues to smoke and has Chantix at home but has not started yet. He would like to quit but is worried about becoming depressed on the medication since he has a history of depression.   He has a living will and power of attorney.   He has eye exams and sees a dentist on a yearly basis. He has not had a colonoscopy in greater than 10 years.Previous colonoscopy showed benign polyps.    He sees a Armed forces operational officer on regular basis, last time was 3 weeks ago.   His only concern today is that of urinary frequency . He has noticed that when he drinks fluids, alcohol, water, tea, that he has to go to the restroom right away. He is getting up once in the middle of the night to urinate. Denies incomplete bladder emptying or decrease in stream. No burning with urination.   Review of Systems  Constitutional: Negative.   HENT: Negative.   Eyes: Negative.   Respiratory: Negative.   Cardiovascular: Negative.   Gastrointestinal: Negative.   Endocrine: Negative.   Genitourinary: Negative.   Musculoskeletal: Negative.   Skin: Negative.   Allergic/Immunologic: Negative.   Neurological: Negative.   Hematological: Negative.   Psychiatric/Behavioral: Negative.   All other systems reviewed and are negative.  Past Medical History  Diagnosis Date  . Hyperlipidemia   . Obese   . Tobacco abuse   . ED (erectile dysfunction)   . Recurrent boils     Social History   Social History  . Marital Status: Single    Spouse Name: N/A  . Number of Children: N/A  . Years of Education: N/A   Occupational History  . Not on file.   Social History Main Topics  . Smoking status:  Current Every Day Smoker -- 1.00 packs/day for 20 years    Types: Cigarettes  . Smokeless tobacco: Never Used  . Alcohol Use: 4.8 oz/week    8 Glasses of wine per week  . Drug Use: No  . Sexual Activity: Not on file   Other Topics Concern  . Not on file   Social History Narrative    Past Surgical History  Procedure Laterality Date  . Elbow fracture surgery Left     left  . Cholecystectomy N/A 02/14/2013    Procedure: LAPAROSCOPIC CHOLECYSTECTOMY;  Surgeon: Ernestene Mention, MD;  Location: [facility] OR;  Service: General;  Laterality: N/A;    Family History  Problem Relation Age of Onset  . Heart disease Father   . Obesity Father   . Schizophrenia Father   . Kidney disease Mother   . Hearing loss Mother     due to whooping cough  . Multiple sclerosis Brother   . Hyperlipidemia Brother     No Known Allergies  Current Outpatient Prescriptions on File Prior to Visit  Medication Sig Dispense Refill  . amitriptyline (ELAVIL) 25 MG tablet Take 1 tablet (25 mg total) by mouth at bedtime. 90 tablet 2  . aspirin 81 MG tablet Take 81 mg by mouth at bedtime.     . mupirocin ointment (BACTROBAN) 2 % Apply 1 application topically  daily.    . varenicline (CHANTIX) 1 MG tablet Take 0.5 mg by mouth daily.     No current facility-administered medications on file prior to visit.    BP 110/78 mmHg  Temp(Src) 98.3 F (36.8 C) (Oral)  Ht 6' (1.829 m)  Wt 256 lb 9.6 oz (116.393 kg)  BMI 34.79 kg/m2       Objective:   Physical Exam  Constitutional: He is oriented to person, place, and time. He appears well-developed and well-nourished. No distress.  obese  HENT:  Head: Normocephalic and atraumatic.  Right Ear: External ear normal.  Left Ear: External ear normal.  Nose: Nose normal.  Mouth/Throat: Oropharynx is clear and moist. No oropharyngeal exudate.  Eyes: Conjunctivae are normal. Pupils are equal, round, and reactive to light. Right eye exhibits no discharge. Left eye exhibits  no discharge. No scleral icterus.  Neck: Normal range of motion. Neck supple. No tracheal deviation present. No thyromegaly present.  Cardiovascular: Normal rate, regular rhythm, normal heart sounds and intact distal pulses.  Exam reveals no gallop and no friction rub.   No murmur heard. No carotid bruit  Pulmonary/Chest: Effort normal and breath sounds normal. No respiratory distress. He has no wheezes. He has no rales. He exhibits no tenderness.  Abdominal: Soft. Bowel sounds are normal. He exhibits no distension and no mass. There is no tenderness. There is no rebound and no guarding.  Genitourinary: Rectum normal, prostate normal and penis normal. Guaiac negative stool. No penile tenderness.  Prostate symmetrical and smooth  Musculoskeletal: Normal range of motion. He exhibits no edema or tenderness.  Lymphadenopathy:    He has no cervical adenopathy.  Neurological: He is alert and oriented to person, place, and time. He has normal reflexes. Coordination normal.  Skin: Skin is warm and dry. No rash noted. He is not diaphoretic. No erythema. No pallor.  Psychiatric: He has a normal mood and affect. His behavior is normal. Judgment and thought content normal.  Nursing note and vitals reviewed.      Assessment & Plan:  1. Routine general medical examination at a health care facility - Reviewed labs with patient.  - Ambulatory referral to Gastroenterology - EKG 12-Lead- NSR, Rate 88 - Follow up in one year for CPE - Follow up sooner if needed  2. Hyperlipidemia - EKG 12-Lead - Has much improved since last visit.   3. Urinary frequency - Decrease frequency of alcohol and tea - Does not want to use medication at this time.

## 2014-08-23 NOTE — Patient Instructions (Addendum)
It was great seeing you today!  Your labs look good. We just need to get you to quit smoking. Please let me know if Chantix does not work, we have other options if needed.   Continue to work on diet and exercise.   Please let me know if you need anything.    Health Maintenance A healthy lifestyle and preventative care can promote health and wellness.  Maintain regular health, dental, and eye exams.  Eat a healthy diet. Foods like vegetables, fruits, whole grains, low-fat dairy products, and lean protein foods contain the nutrients you need and are low in calories. Decrease your intake of foods high in solid fats, added sugars, and salt. Get information about a proper diet from your health care provider, if necessary.  Regular physical exercise is one of the most important things you can do for your health. Most adults should get at least 150 minutes of moderate-intensity exercise (any activity that increases your heart rate and causes you to sweat) each week. In addition, most adults need muscle-strengthening exercises on 2 or more days a week.   Maintain a healthy weight. The body mass index (BMI) is a screening tool to identify possible weight problems. It provides an estimate of body fat based on height and weight. Your health care provider can find your BMI and can help you achieve or maintain a healthy weight. For males 20 years and older:  A BMI below 18.5 is considered underweight.  A BMI of 18.5 to 24.9 is normal.  A BMI of 25 to 29.9 is considered overweight.  A BMI of 30 and above is considered obese.  Maintain normal blood lipids and cholesterol by exercising and minimizing your intake of saturated fat. Eat a balanced diet with plenty of fruits and vegetables. Blood tests for lipids and cholesterol should begin at age 50 and be repeated every 5 years. If your lipid or cholesterol levels are high, you are over age 68, or you are at high risk for heart disease, you may need your  cholesterol levels checked more frequently.Ongoing high lipid and cholesterol levels should be treated with medicines if diet and exercise are not working.  If you smoke, find out from your health care provider how to quit. If you do not use tobacco, do not start.  Lung cancer screening is recommended for adults aged 55-80 years who are at high risk for developing lung cancer because of a history of smoking. A yearly low-dose CT scan of the lungs is recommended for people who have at least a 30-pack-year history of smoking and are current smokers or have quit within the past 15 years. A pack year of smoking is smoking an average of 1 pack of cigarettes a day for 1 year (for example, a 30-pack-year history of smoking could mean smoking 1 pack a day for 30 years or 2 packs a day for 15 years). Yearly screening should continue until the smoker has stopped smoking for at least 15 years. Yearly screening should be stopped for people who develop a health problem that would prevent them from having lung cancer treatment.  If you choose to drink alcohol, do not have more than 2 drinks per day. One drink is considered to be 12 oz (360 mL) of beer, 5 oz (150 mL) of wine, or 1.5 oz (45 mL) of liquor.  Avoid the use of street drugs. Do not share needles with anyone. Ask for help if you need support or instructions about stopping  the use of drugs.  High blood pressure causes heart disease and increases the risk of stroke. Blood pressure should be checked at least every 1-2 years. Ongoing high blood pressure should be treated with medicines if weight loss and exercise are not effective.  If you are 49-66 years old, ask your health care provider if you should take aspirin to prevent heart disease.  Diabetes screening involves taking a blood sample to check your fasting blood sugar level. This should be done once every 3 years after age 88 if you are at a normal weight and without risk factors for diabetes. Testing  should be considered at a younger age or be carried out more frequently if you are overweight and have at least 1 risk factor for diabetes.  Colorectal cancer can be detected and often prevented. Most routine colorectal cancer screening begins at the age of 99 and continues through age 61. However, your health care provider may recommend screening at an earlier age if you have risk factors for colon cancer. On a yearly basis, your health care provider may provide home test kits to check for hidden blood in the stool. A small camera at the end of a tube may be used to directly examine the colon (sigmoidoscopy or colonoscopy) to detect the earliest forms of colorectal cancer. Talk to your health care provider about this at age 49 when routine screening begins. A direct exam of the colon should be repeated every 5-10 years through age 19, unless early forms of precancerous polyps or small growths are found.  People who are at an increased risk for hepatitis B should be screened for this virus. You are considered at high risk for hepatitis B if:  You were born in a country where hepatitis B occurs often. Talk with your health care provider about which countries are considered high risk.  Your parents were born in a high-risk country and you have not received a shot to protect against hepatitis B (hepatitis B vaccine).  You have HIV or AIDS.  You use needles to inject street drugs.  You live with, or have sex with, someone who has hepatitis B.  You are a man who has sex with other men (MSM).  You get hemodialysis treatment.  You take certain medicines for conditions like cancer, organ transplantation, and autoimmune conditions.  Hepatitis C blood testing is recommended for all people born from [birth year] through 1965 and any individual with known risk factors for hepatitis C.  Healthy men should no longer receive prostate-specific antigen (PSA) blood tests as part of routine cancer screening. Talk to  your health care provider about prostate cancer screening.  Testicular cancer screening is not recommended for adolescents or adult males who have no symptoms. Screening includes self-exam, a health care provider exam, and other screening tests. Consult with your health care provider about any symptoms you have or any concerns you have about testicular cancer.  Practice safe sex. Use condoms and avoid high-risk sexual practices to reduce the spread of sexually transmitted infections (STIs).  You should be screened for STIs, including gonorrhea and chlamydia if:  You are sexually active and are younger than 24 years.  You are older than 24 years, and your health care provider tells you that you are at risk for this type of infection.  Your sexual activity has changed since you were last screened, and you are at an increased risk for chlamydia or gonorrhea. Ask your health care provider if you are at  risk.  If you are at risk of being infected with HIV, it is recommended that you take a prescription medicine daily to prevent HIV infection. This is called pre-exposure prophylaxis (PrEP). You are considered at risk if:  You are a man who has sex with other men (MSM).  You are a heterosexual man who is sexually active with multiple partners.  You take drugs by injection.  You are sexually active with a partner who has HIV.  Talk with your health care provider about whether you are at high risk of being infected with HIV. If you choose to begin PrEP, you should first be tested for HIV. You should then be tested every 3 months for as long as you are taking PrEP.  Use sunscreen. Apply sunscreen liberally and repeatedly throughout the day. You should seek shade when your shadow is shorter than you. Protect yourself by wearing long sleeves, pants, a wide-brimmed hat, and sunglasses year round whenever you are outdoors.  Tell your health care provider of new moles or changes in moles, especially if  there is a change in shape or color. Also, tell your health care provider if a mole is larger than the size of a pencil eraser.  A one-time screening for abdominal aortic aneurysm (AAA) and surgical repair of large AAAs by ultrasound is recommended for men aged 65-75 years who are current or former smokers.  Stay current with your vaccines (immunizations). Document Released: 06/16/2007 Document Revised: 12/23/2012 Document Reviewed: 05/15/2010 [facility] Patient Information 2015 [place], [place]. This information is not intended to replace advice given to you by your health care provider. Make sure you discuss any questions you have with your health care provider.

## 2014-09-24 ENCOUNTER — Encounter: Payer: Self-pay | Admitting: Internal Medicine

## 2015-03-30 ENCOUNTER — Other Ambulatory Visit: Payer: Self-pay | Admitting: Adult Health

## 2015-03-30 NOTE — Telephone Encounter (Signed)
Dr.Todd patient. I will forward this request to him

## 2015-03-30 NOTE — Telephone Encounter (Signed)
Last refilled 07/15/14 for #90 with 2 refills. Last office visit was 08/23/14. Ok to refill?

## 2015-08-11 IMAGING — US US ABDOMEN COMPLETE
1 series · 14 of 25 positions shown · non-contrast
Comparison: None.

CLINICAL DATA: Right upper quadrant pain, nausea.

EXAM:
ULTRASOUND ABDOMEN COMPLETE

[Series 1: us abdomen complete · 0.28mm/px · 14 of 60 slices shown]
[im 1/60]
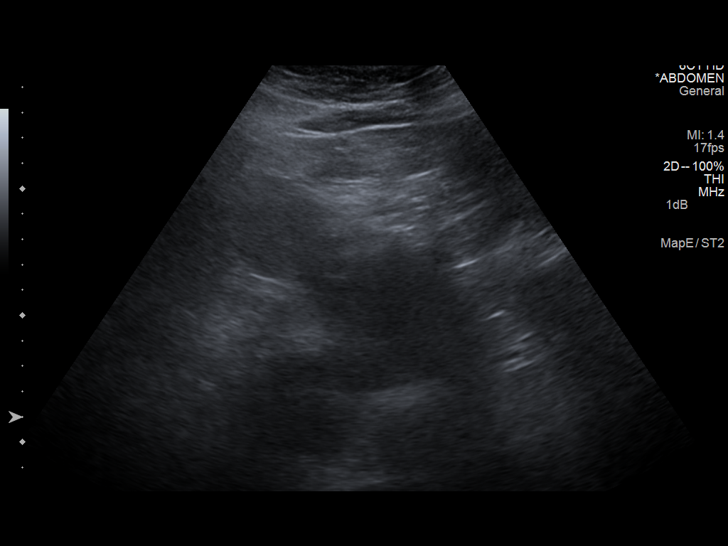
[im 5/60]
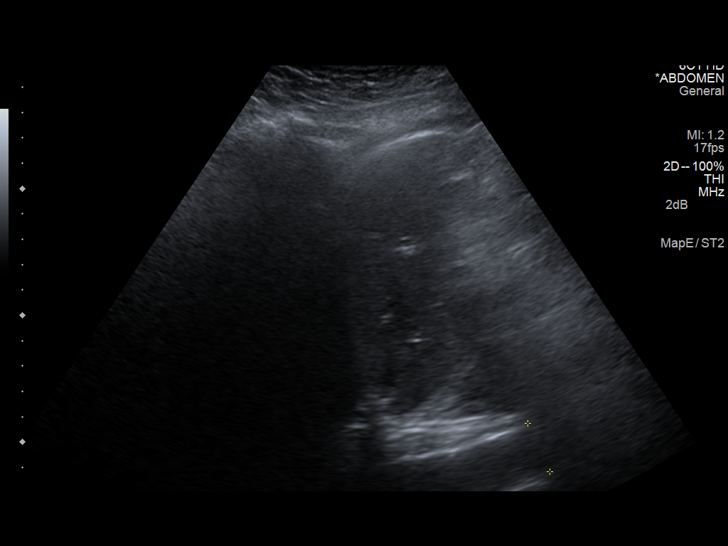
[im 10/60]
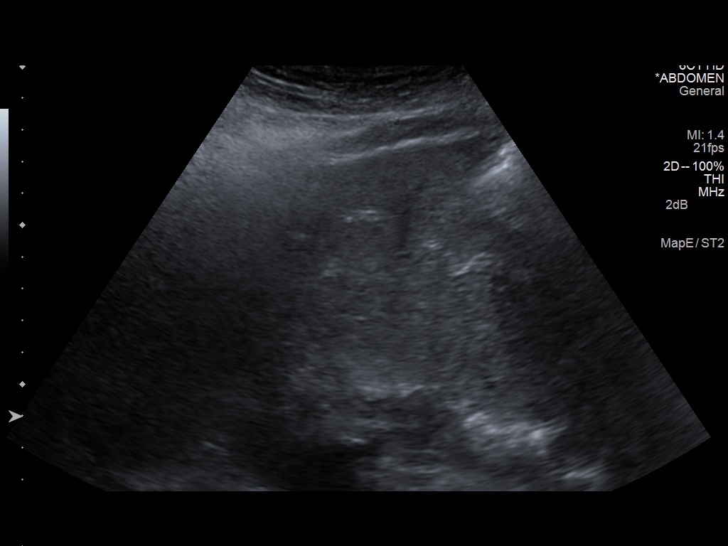
[im 15/60]
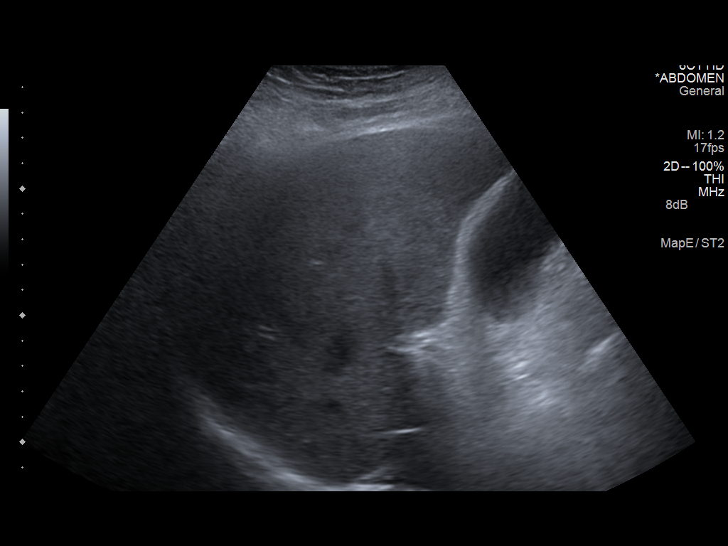
[im 20/60]
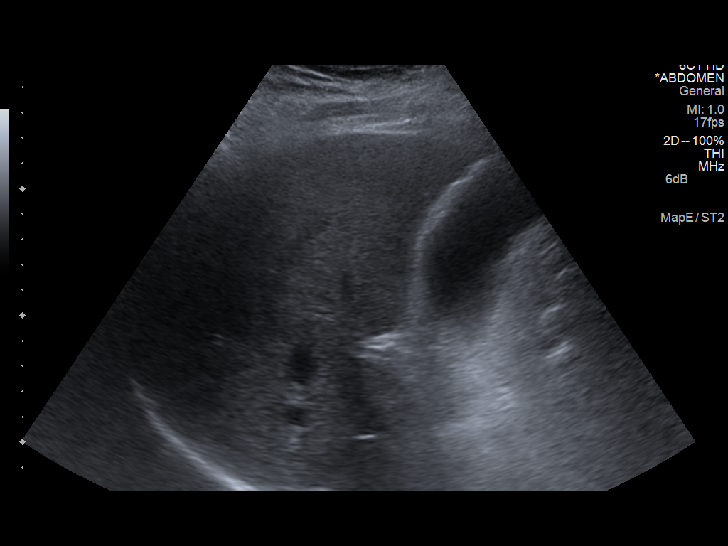
[im 23/60]
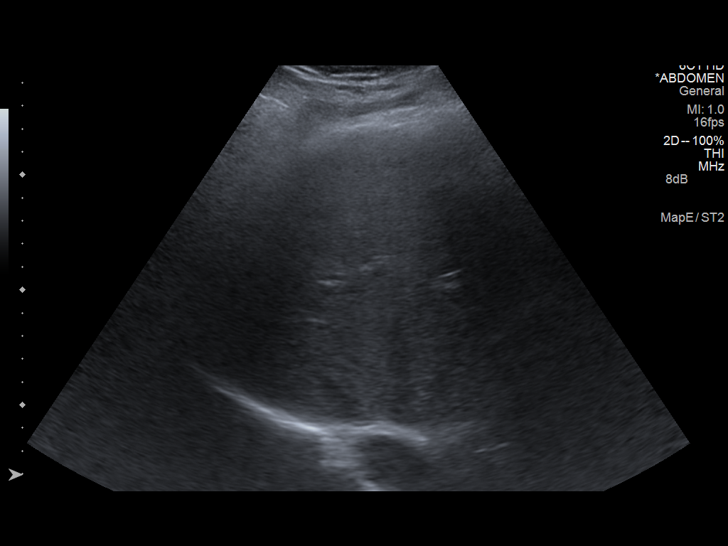
[im 28/60]
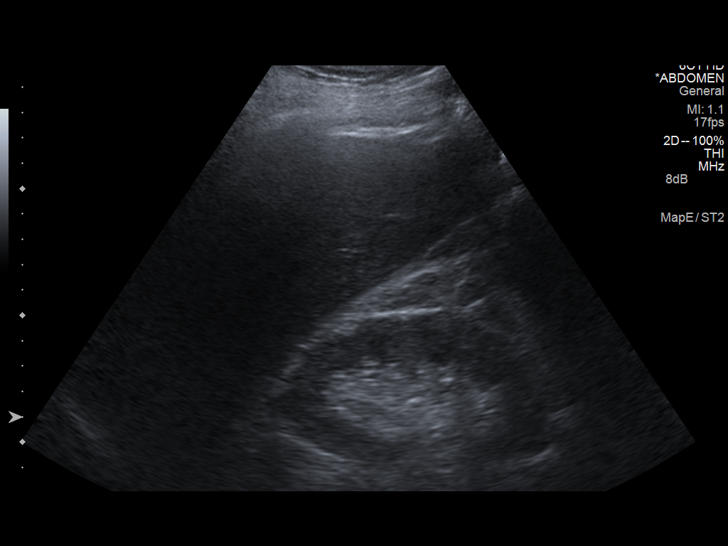
[im 32/60]
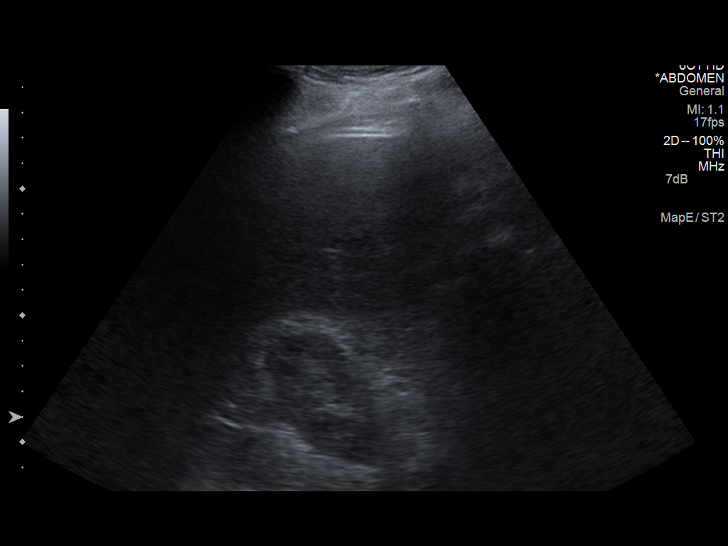
[im 37/60]
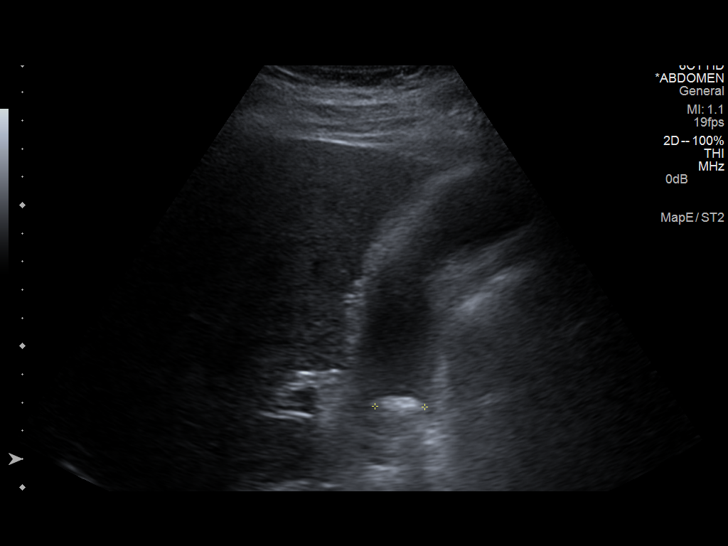
[im 40/60]
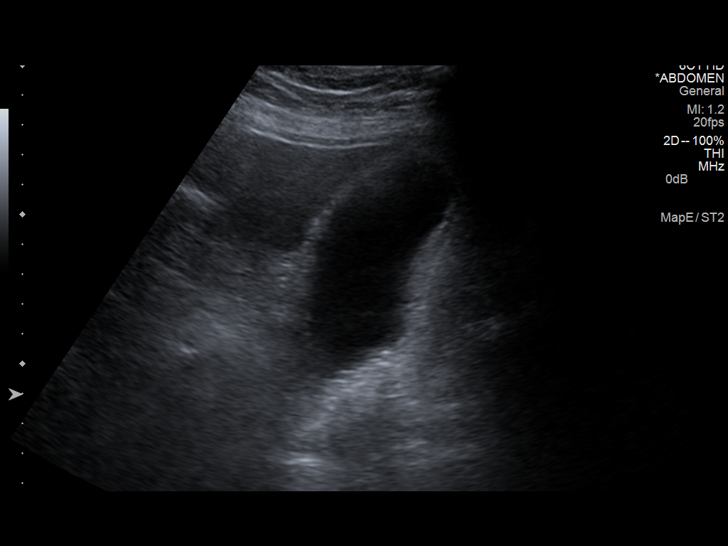
[im 45/60]
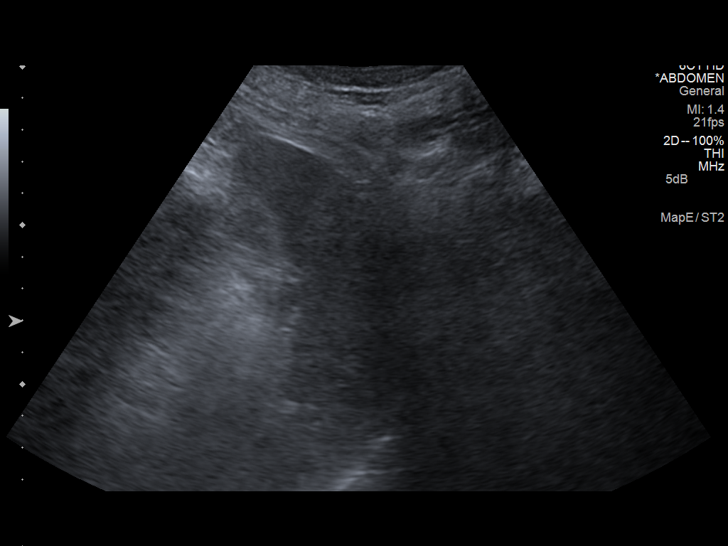
[im 50/60]
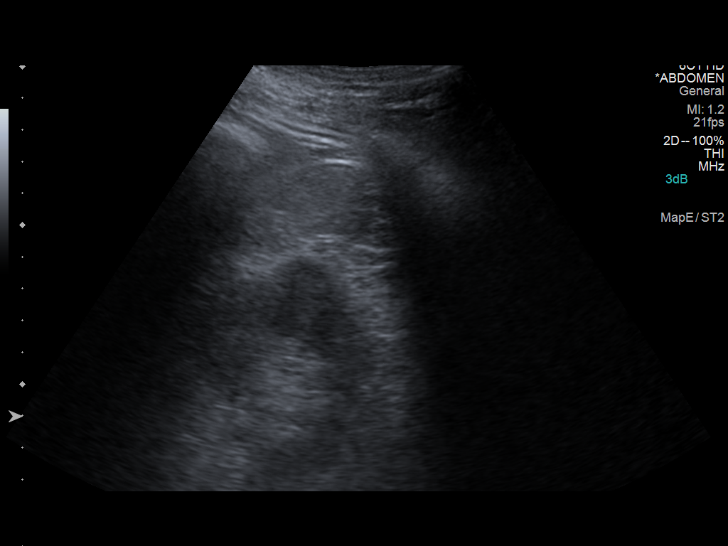
[im 55/60]
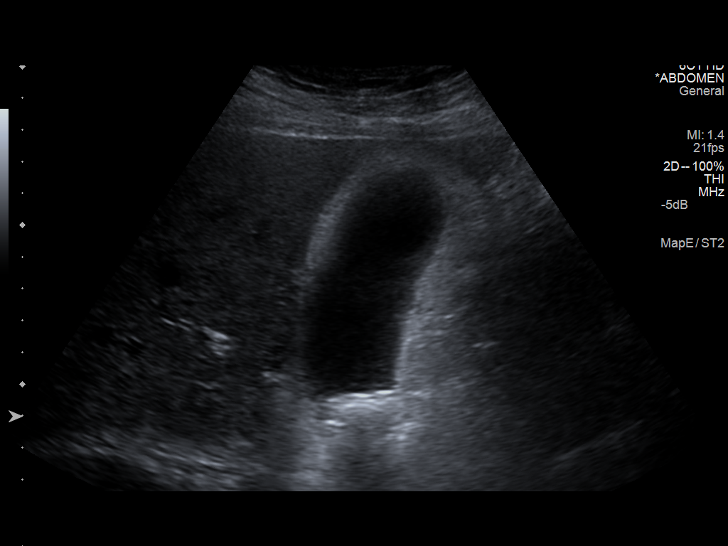
[im 60/60]
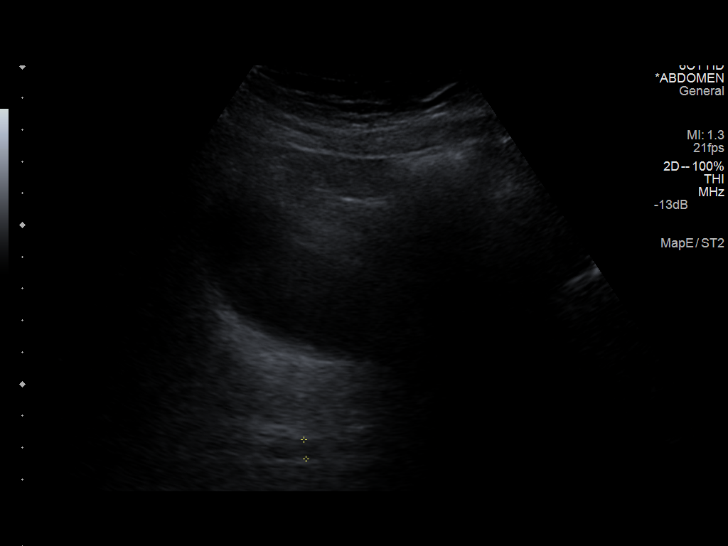

[14 of 25 positions shown; findings below may reference images not displayed]

FINDINGS: Gallbladder:

18 mm gallstone in the region of the gallbladder mid neck. Multiple
other smaller gallstones. Mild wall thickening, measuring 4 mm.
Negative sonographic Murphy's sign.

Common bile duct:

Diameter: Normal caliber, 6 mm.

Liver:

No focal lesion identified. Within normal limits in parenchymal
echogenicity.

IVC:

No abnormality visualized.

Pancreas:

Visualized portion unremarkable.

Spleen:

Size and appearance within normal limits.

Right Kidney:

Length: 11.5 cm. Echogenicity within normal limits. No mass or
hydronephrosis visualized.

Left Kidney:

Length: 12.6 cm. Echogenicity within normal limits. No mass or
hydronephrosis visualized.

Abdominal aorta:

No aneurysm visualized.

Other findings:

None.
IMPRESSION: Multiple gallstones, the largest is a non mobile gallstone in the
region of the gallbladder neck measuring 18 mm. Mild wall thickening
without sonographic Murphy's sign.

## 2015-08-22 ENCOUNTER — Other Ambulatory Visit: Payer: BC Managed Care – PPO

## 2015-08-29 ENCOUNTER — Encounter: Payer: BC Managed Care – PPO | Admitting: Family Medicine

## 2015-09-12 ENCOUNTER — Ambulatory Visit (INDEPENDENT_AMBULATORY_CARE_PROVIDER_SITE_OTHER): Payer: BC Managed Care – PPO | Admitting: Emergency Medicine

## 2015-09-12 ENCOUNTER — Other Ambulatory Visit (INDEPENDENT_AMBULATORY_CARE_PROVIDER_SITE_OTHER): Payer: BC Managed Care – PPO

## 2015-09-12 DIAGNOSIS — Z0000 Encounter for general adult medical examination without abnormal findings: Secondary | ICD-10-CM

## 2015-09-12 DIAGNOSIS — Z23 Encounter for immunization: Secondary | ICD-10-CM

## 2015-09-12 LAB — CBC WITH DIFFERENTIAL/PLATELET
BASOS ABS: 0 10*3/uL (ref 0.0–0.1)
Basophils Relative: 0.5 % (ref 0.0–3.0)
EOS ABS: 0.2 10*3/uL (ref 0.0–0.7)
Eosinophils Relative: 2.9 % (ref 0.0–5.0)
HEMATOCRIT: 48.2 % (ref 39.0–52.0)
Hemoglobin: 16.5 g/dL (ref 13.0–17.0)
LYMPHS PCT: 37.4 % (ref 12.0–46.0)
Lymphs Abs: 2.2 10*3/uL (ref 0.7–4.0)
MCHC: 34.2 g/dL (ref 30.0–36.0)
MCV: 89.7 fl (ref 78.0–100.0)
Monocytes Absolute: 0.5 10*3/uL (ref 0.1–1.0)
Monocytes Relative: 8.6 % (ref 3.0–12.0)
NEUTROS ABS: 3 10*3/uL (ref 1.4–7.7)
NEUTROS PCT: 50.6 % (ref 43.0–77.0)
PLATELETS: 251 10*3/uL (ref 150.0–400.0)
RBC: 5.37 Mil/uL (ref 4.22–5.81)
RDW: 13.3 % (ref 11.5–15.5)
WBC: 5.9 10*3/uL (ref 4.0–10.5)

## 2015-09-12 LAB — LIPID PANEL
CHOL/HDL RATIO: 5
Cholesterol: 193 mg/dL (ref 0–200)
HDL: 38 mg/dL — AB (ref >39.00)
LDL CALC: 133 mg/dL — AB (ref 0–99)
NONHDL: 154.68
Triglycerides: 109 mg/dL (ref 0.0–149.0)
VLDL: 21.8 mg/dL (ref 0.0–40.0)

## 2015-09-12 LAB — BASIC METABOLIC PANEL
BUN: 18 mg/dL (ref 6–23)
CALCIUM: 8.6 mg/dL (ref 8.4–10.5)
CO2: 26 meq/L (ref 19–32)
CREATININE: 0.88 mg/dL (ref 0.40–1.50)
Chloride: 107 mEq/L (ref 96–112)
GFR: 95.48 mL/min (ref >60.00)
Glucose, Bld: 104 mg/dL — ABNORMAL HIGH (ref 70–99)
Potassium: 4.7 mEq/L (ref 3.5–5.1)
Sodium: 139 mEq/L (ref 135–145)

## 2015-09-12 LAB — POC URINALSYSI DIPSTICK (AUTOMATED)
BILIRUBIN UA: NEGATIVE
Blood, UA: NEGATIVE
GLUCOSE UA: NEGATIVE
KETONES UA: NEGATIVE
Leukocytes, UA: NEGATIVE
Nitrite, UA: NEGATIVE
SPEC GRAV UA: 1.025
Urobilinogen, UA: 1
pH, UA: 5.5

## 2015-09-12 LAB — HEPATIC FUNCTION PANEL
ALT: 22 U/L (ref 0–53)
AST: 15 U/L (ref 0–37)
Albumin: 4.1 g/dL (ref 3.5–5.2)
Alkaline Phosphatase: 60 U/L (ref 39–117)
BILIRUBIN DIRECT: 0.1 mg/dL (ref 0.0–0.3)
TOTAL PROTEIN: 6.4 g/dL (ref 6.0–8.3)
Total Bilirubin: 0.3 mg/dL (ref 0.2–1.2)

## 2015-09-12 LAB — TSH: TSH: 1.73 u[IU]/mL (ref 0.35–4.50)

## 2015-09-12 LAB — PSA: PSA: 0.96 ng/mL (ref 0.10–4.00)

## 2015-09-19 ENCOUNTER — Encounter: Payer: Self-pay | Admitting: Family Medicine

## 2015-09-19 ENCOUNTER — Ambulatory Visit (INDEPENDENT_AMBULATORY_CARE_PROVIDER_SITE_OTHER): Payer: BC Managed Care – PPO | Admitting: Family Medicine

## 2015-09-19 VITALS — BP 132/90 | HR 112 | Temp 98.3°F | Ht 72.0 in | Wt 274.3 lb

## 2015-09-19 DIAGNOSIS — R7309 Other abnormal glucose: Secondary | ICD-10-CM

## 2015-09-19 DIAGNOSIS — E785 Hyperlipidemia, unspecified: Secondary | ICD-10-CM

## 2015-09-19 DIAGNOSIS — Z720 Tobacco use: Secondary | ICD-10-CM | POA: Diagnosis not present

## 2015-09-19 DIAGNOSIS — E8881 Metabolic syndrome: Secondary | ICD-10-CM | POA: Insufficient documentation

## 2015-09-19 DIAGNOSIS — R739 Hyperglycemia, unspecified: Secondary | ICD-10-CM | POA: Insufficient documentation

## 2015-09-19 MED ORDER — VARENICLINE TARTRATE 1 MG PO TABS: 0.5000 mg | ORAL_TABLET | Freq: Every day | ORAL | 5 refills | Status: DC

## 2015-09-19 MED ORDER — AMITRIPTYLINE HCL 50 MG PO TABS: 50.0000 mg | ORAL_TABLET | Freq: Every day | ORAL | 3 refills | Status: DC

## 2015-09-19 NOTE — Progress Notes (Signed)
Marc Brown is a 55 year old male smoker........ one pack a day...Marland Kitchen.Marland Kitchen.Marland Kitchen. who comes in today for general physical examination  We gave him a prescription for the Chantix last year but he never got it filled. Advised him to start the Chantix program. He smoked a pack of cigarettes a day.  He's also gained about 17 pounds. 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 He's not been able to exercise he's not been able to follow diet.  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 His tenderness is recommending a mouth guard.  Fasting blood sugar 104  Vaccinations up-to-date tetanus booster 2007 seasonal flu shot given September 11.  Colonoscopy last was 2006, he's due for follow-up colonoscopy. He gets routine eye care and dental care as noted above.  Review of systems otherwise negative  Physical examination  Vital signs stable he is afebrile HEENT were negative neck was supple no adenopathy thyroid not enlarged no carotid bruits cardiopulmonary exam normal abdominal exam normal genitalia normal circumcised male rectum normal stool guaiac-negative prostate normal extremities normal skin normal peripheral pulses normal  Impression  #1 metabolic syndrome....... Obesity......Marland Kitchen. elevated blood sugar..... LDL cholesterol 133...... added factor tobacco abuse.......Marland Kitchen. recommend diet exercise and weight loss  #2 tobacco abuse,,,,,,,, Chantix program outlined  #3 TMJ,,,,,,,,,, increase Elavil to 50 mg at bedtime,,,,,,,, mouthguard  Discussed in detail the above follow-up in 3 months  Labs one week prior

## 2015-09-19 NOTE — Patient Instructions (Signed)
Restart your diet,,,,,,,,,,, no high fructose corn syrup,,,,,,,,, walk 30 minutes daily  Chantix program,,,,,,,,,, 0.5 mg in the morning for 2 weeks then increase to 0.5 twice daily  Taper by 3 per week  Set a quit date in 7 weeks  If he has side effects from the 0.5 mg of Chantix daily,,,,,,,,,,,, decreased 2.5 mg Monday Wednesday Friday  Follow-up in 3 months because your blood sugars elevated,,,,,,,,,, nonfasting labs one week prior

## 2015-09-19 NOTE — Progress Notes (Signed)
Pre visit review using our clinic review tool, if applicable. No additional management support is needed unless otherwise documented below in the visit note. 

## 2015-10-04 ENCOUNTER — Encounter: Payer: Self-pay | Admitting: Internal Medicine

## 2015-11-04 ENCOUNTER — Ambulatory Visit (AMBULATORY_SURGERY_CENTER): Payer: Self-pay | Admitting: *Deleted

## 2015-11-04 VITALS — Ht 72.0 in | Wt 272.6 lb

## 2015-11-04 DIAGNOSIS — Z86010 Personal history of colonic polyps: Secondary | ICD-10-CM

## 2015-11-04 NOTE — Progress Notes (Signed)
No egg or soy allergy  No anesthesia or intubation problems per pt  No diet medications taken  Registered in EMMI   

## 2015-11-22 ENCOUNTER — Ambulatory Visit (INDEPENDENT_AMBULATORY_CARE_PROVIDER_SITE_OTHER): Payer: BC Managed Care – PPO | Admitting: Family Medicine

## 2015-11-22 ENCOUNTER — Encounter: Payer: Self-pay | Admitting: Family Medicine

## 2015-11-22 VITALS — BP 140/102 | HR 87 | Temp 98.0°F | Ht 72.0 in | Wt 272.0 lb

## 2015-11-22 DIAGNOSIS — R197 Diarrhea, unspecified: Secondary | ICD-10-CM | POA: Diagnosis not present

## 2015-11-22 MED ORDER — FLUTICASONE PROPIONATE HFA 44 MCG/ACT IN AERO: 1.0000 | INHALATION_SPRAY | Freq: Two times a day (BID) | RESPIRATORY_TRACT | 6 refills | Status: DC

## 2015-11-22 NOTE — Patient Instructions (Signed)
Continue the probiotics  Clear liquid diet  Workup today for C. difficile colitis  We will call you ASAP........... also you can check the results on my chart

## 2015-11-22 NOTE — Progress Notes (Signed)
Marc Brown is a 55 year old male who comes in today for evaluation of diarrhea  He went to see his oral surgeon because bacteria got onto her a crown of a molar that had a root canal 20 years ago. They started him on clindamycin 4 times daily on November 7. After a day or 2 it didn't seem to help so they doubled the both dose and he took 2 tabs 4 times a day for 1 day and back to 1 tablet 4 times a day. Almost immediately the diarrhea started. He has no fever chills or vomiting. No abdominal pain or cramping. He has about 5 bowel movements a day. He finished the clindamycin on November 14 and had surgery on November 17. The tooth appears well but the diarrhea persists. He wonders if he didn't get C. difficile colitis from the clindamycin.  Review of systems otherwise negative except that he is continuing to smoke and has a flareup of his asthma. He's been using the Flovent 44 one puff twice a day. He would like a refill the Flovent. I asked him to stop smoking completely used the Chantix and the Flovent 1 puff twice a day  Physical examination vital signs stable he is afebrile, exam negative  Impression diarrhea after having been on clindamycin........... continue probiotics..... Clear liquid diet..... Stool culture ASAP...Marland Kitchen.Marland Kitchen.Marland Kitchen. rule out C. difficile  Asthma with tobacco abuse ongoing........ stop smoking........ Chantix 1 daily...Marland Kitchen.Marland Kitchen.Marland Kitchen. Flovent 44 one puff twice a day

## 2015-11-23 LAB — CLOSTRIDIUM DIFFICILE BY PCR: CDIFFPCR: NOT DETECTED

## 2015-11-26 LAB — STOOL CULTURE

## 2015-12-01 ENCOUNTER — Encounter: Payer: Self-pay | Admitting: Internal Medicine

## 2015-12-12 ENCOUNTER — Other Ambulatory Visit (INDEPENDENT_AMBULATORY_CARE_PROVIDER_SITE_OTHER): Payer: BC Managed Care – PPO

## 2015-12-12 DIAGNOSIS — R739 Hyperglycemia, unspecified: Secondary | ICD-10-CM

## 2015-12-12 LAB — HEMOGLOBIN A1C: HEMOGLOBIN A1C: 5.7 % (ref 4.6–6.5)

## 2015-12-13 ENCOUNTER — Encounter: Payer: BC Managed Care – PPO | Admitting: Internal Medicine

## 2015-12-19 ENCOUNTER — Ambulatory Visit (INDEPENDENT_AMBULATORY_CARE_PROVIDER_SITE_OTHER): Payer: BC Managed Care – PPO | Admitting: Family Medicine

## 2015-12-19 ENCOUNTER — Encounter: Payer: Self-pay | Admitting: Family Medicine

## 2015-12-19 VITALS — BP 120/80 | HR 90 | Temp 98.2°F | Ht 72.0 in | Wt 273.6 lb

## 2015-12-19 DIAGNOSIS — E6609 Other obesity due to excess calories: Secondary | ICD-10-CM

## 2015-12-19 DIAGNOSIS — Z6837 Body mass index (BMI) 37.0-37.9, adult: Secondary | ICD-10-CM | POA: Diagnosis not present

## 2015-12-19 DIAGNOSIS — E7800 Pure hypercholesterolemia, unspecified: Secondary | ICD-10-CM

## 2015-12-19 DIAGNOSIS — R739 Hyperglycemia, unspecified: Secondary | ICD-10-CM

## 2015-12-19 NOTE — Progress Notes (Signed)
Marc Brown is a 55 year old male smoker who comes in today for follow-up of elevated blood sugar  His blood sugar 3 months ago was 104. He's back because we did an A1c. A1c is in the nondiabetic range 5.7. However with the smoking and his weight these are critical risk factors for vascular disease.  We long discussion about vascular disease heart attack stroke etc. etc. He is committed to going back to [employer] in January after the holidays.  Also encouraged him to start the Chantix and get off the cigarettes.  The severe bout of colitis that he had.....Marland Kitchen. culture showed no C. Difficile..... Resolved spontaneously with probiotics etc. I would recommend that he never take Lincocin again  Physical evaluation vital signs stable he is afebrile  Glucose intolerance........ diet exercise weight loss follow-up A1c in 6 months  #2 tobacco abuse...Marland Kitchen.Marland Kitchen.Marland Kitchen. restart Chantix program

## 2015-12-19 NOTE — Patient Instructions (Signed)
Re-start the Chantix program as we discussed  [employer] as you've outlined a  Follow-up in June for fasting labs.......Marland Kitchen. we will call you the report

## 2015-12-19 NOTE — Progress Notes (Signed)
Pre visit review using our clinic review tool, if applicable. No additional management support is needed unless otherwise documented below in the visit note. 

## 2016-08-28 ENCOUNTER — Other Ambulatory Visit: Payer: Self-pay | Admitting: Family Medicine

## 2016-09-21 ENCOUNTER — Encounter: Payer: Self-pay | Admitting: Family Medicine

## 2016-11-26 ENCOUNTER — Other Ambulatory Visit: Payer: Self-pay | Admitting: Family Medicine

## 2017-02-24 ENCOUNTER — Other Ambulatory Visit: Payer: Self-pay | Admitting: Family Medicine

## 2017-02-25 NOTE — Telephone Encounter (Signed)
Pt has been scheduled for a CPE/Fasting on 05/07/2017 at 9 am. Rx has been refilled.

## 2017-05-07 ENCOUNTER — Encounter: Payer: BC Managed Care – PPO | Admitting: Family Medicine

## 2017-05-13 ENCOUNTER — Encounter: Payer: Self-pay | Admitting: Family Medicine

## 2017-05-13 ENCOUNTER — Ambulatory Visit (INDEPENDENT_AMBULATORY_CARE_PROVIDER_SITE_OTHER): Payer: BC Managed Care – PPO | Admitting: Family Medicine

## 2017-05-13 VITALS — BP 118/78 | HR 94 | Temp 98.3°F | Ht 72.0 in | Wt 276.0 lb

## 2017-05-13 DIAGNOSIS — Z23 Encounter for immunization: Secondary | ICD-10-CM

## 2017-05-13 DIAGNOSIS — N401 Enlarged prostate with lower urinary tract symptoms: Secondary | ICD-10-CM | POA: Diagnosis not present

## 2017-05-13 DIAGNOSIS — Z720 Tobacco use: Secondary | ICD-10-CM

## 2017-05-13 DIAGNOSIS — R351 Nocturia: Secondary | ICD-10-CM | POA: Diagnosis not present

## 2017-05-13 DIAGNOSIS — Z0000 Encounter for general adult medical examination without abnormal findings: Secondary | ICD-10-CM

## 2017-05-13 DIAGNOSIS — F528 Other sexual dysfunction not due to a substance or known physiological condition: Secondary | ICD-10-CM | POA: Diagnosis not present

## 2017-05-13 DIAGNOSIS — Z86010 Personal history of colonic polyps: Secondary | ICD-10-CM

## 2017-05-13 DIAGNOSIS — Z6837 Body mass index (BMI) 37.0-37.9, adult: Secondary | ICD-10-CM

## 2017-05-13 DIAGNOSIS — E6609 Other obesity due to excess calories: Secondary | ICD-10-CM

## 2017-05-13 LAB — BASIC METABOLIC PANEL
BUN: 11 mg/dL (ref 6–23)
CHLORIDE: 105 meq/L (ref 96–112)
CO2: 25 mEq/L (ref 19–32)
Calcium: 8.6 mg/dL (ref 8.4–10.5)
Creatinine, Ser: 0.83 mg/dL (ref 0.40–1.50)
GFR: 101.53 mL/min (ref >60.00)
GLUCOSE: 104 mg/dL — AB (ref 70–99)
Potassium: 4.3 mEq/L (ref 3.5–5.1)
Sodium: 136 mEq/L (ref 135–145)

## 2017-05-13 LAB — CBC WITH DIFFERENTIAL/PLATELET
BASOS PCT: 0.8 % (ref 0.0–3.0)
Basophils Absolute: 0 10*3/uL (ref 0.0–0.1)
EOS ABS: 0.1 10*3/uL (ref 0.0–0.7)
Eosinophils Relative: 2.4 % (ref 0.0–5.0)
HEMATOCRIT: 48.2 % (ref 39.0–52.0)
Hemoglobin: 16.7 g/dL (ref 13.0–17.0)
LYMPHS PCT: 33.5 % (ref 12.0–46.0)
Lymphs Abs: 1.7 10*3/uL (ref 0.7–4.0)
MCHC: 34.6 g/dL (ref 30.0–36.0)
MCV: 89 fl (ref 78.0–100.0)
MONOS PCT: 9.6 % (ref 3.0–12.0)
Monocytes Absolute: 0.5 10*3/uL (ref 0.1–1.0)
NEUTROS ABS: 2.8 10*3/uL (ref 1.4–7.7)
Neutrophils Relative %: 53.7 % (ref 43.0–77.0)
PLATELETS: 235 10*3/uL (ref 150.0–400.0)
RBC: 5.42 Mil/uL (ref 4.22–5.81)
RDW: 13.3 % (ref 11.5–15.5)
WBC: 5.1 10*3/uL (ref 4.0–10.5)

## 2017-05-13 LAB — HEPATIC FUNCTION PANEL
ALK PHOS: 63 U/L (ref 39–117)
ALT: 21 U/L (ref 0–53)
AST: 17 U/L (ref 0–37)
Albumin: 3.9 g/dL (ref 3.5–5.2)
BILIRUBIN DIRECT: 0.1 mg/dL (ref 0.0–0.3)
TOTAL PROTEIN: 6.4 g/dL (ref 6.0–8.3)
Total Bilirubin: 0.4 mg/dL (ref 0.2–1.2)

## 2017-05-13 LAB — POCT URINALYSIS DIPSTICK
BILIRUBIN UA: NEGATIVE
Blood, UA: NEGATIVE
Glucose, UA: NEGATIVE
KETONES UA: NEGATIVE
Leukocytes, UA: NEGATIVE
Nitrite, UA: NEGATIVE
ODOR: NEGATIVE
Protein, UA: NEGATIVE
Spec Grav, UA: 1.01 (ref 1.010–1.025)
Urobilinogen, UA: 0.2 E.U./dL
pH, UA: 7.5 (ref 5.0–8.0)

## 2017-05-13 LAB — LIPID PANEL
Cholesterol: 173 mg/dL (ref 0–200)
HDL: 33.8 mg/dL — ABNORMAL LOW (ref >39.00)
LDL Cholesterol: 109 mg/dL — ABNORMAL HIGH (ref 0–99)
NONHDL: 139.09
Total CHOL/HDL Ratio: 5
Triglycerides: 148 mg/dL (ref 0.0–149.0)
VLDL: 29.6 mg/dL (ref 0.0–40.0)

## 2017-05-13 LAB — TSH: TSH: 1.49 u[IU]/mL (ref 0.35–4.50)

## 2017-05-13 LAB — PSA: PSA: 1.24 ng/mL (ref 0.10–4.00)

## 2017-05-13 MED ORDER — SILDENAFIL CITRATE 20 MG PO TABS: ORAL_TABLET | ORAL | 11 refills | Status: DC

## 2017-05-13 MED ORDER — VARENICLINE TARTRATE 1 MG PO TABS: 0.5000 mg | ORAL_TABLET | Freq: Every day | ORAL | 5 refills | Status: DC

## 2017-05-13 MED ORDER — TADALAFIL 20 MG PO TABS: 10.0000 mg | ORAL_TABLET | ORAL | 11 refills | Status: DC | PRN

## 2017-05-13 MED ORDER — AMITRIPTYLINE HCL 50 MG PO TABS: ORAL_TABLET | ORAL | 4 refills | Status: DC

## 2017-05-13 NOTE — Patient Instructions (Addendum)
Labs today............ I will Call if  Anything is  abnormal  Chantix 1 mg............. one half tab daily........Marland Kitchen may increase to one half tab twice daily as needed after one month....... taper as outlined........ follow-up the third week in September  Cialis 10 mg......... one half tab 34 hours prior to sex.......Marland Kitchen or generic Viagra

## 2017-05-13 NOTE — Progress Notes (Signed)
[name] is a 57 -year-old male smoker one pack of cigarettes a day who comes in today for general physical examination  He takes Elavil 50 mg daily at bedtime for sleep dysfunction  Last year we started him on the Chantix program but he never bought the medication. He continues to smoke a pack of cigarettes a day. He now notices she beginning to have issues with ED. We again reinforced the length between smoking and ADD. He says he'll try the Chantix program  He's on his right knee about 4 weeks ago still having some soreness in the posterior portion of his right hamstring. Knee is normal.  Tetanus booster given today. Information given on the shingles vaccine.  At a colonoscopy in 2006 which showed a benign polyp. He went to go back and have a follow-up colonoscopy over when he was told was $3000 out of pocket he canceled the procedure. I recommend he call GI and get his colonoscopy.  He gets routine eye care, dental care,  14 point review of systems reviewed and otherwise negative except for above.  Vs BP 118/78 (BP Location: Left Arm, Patient Position: Sitting, Cuff Size: Large)   Pulse 94   Temp 98.3 F (36.8 C) (Oral)   Ht 6' (1.829 m)   Wt 276 lb (125.2 kg)   BMI 37.43 kg/m  Well-developed well-nourished male no acute distress vital signs stable he's afebrile HEENT were negative neck was supple thyroid is not enlarged no carotid bruits. Cardiopulmonary exam normal. Abdominal exam normal. Genitalia normal circumcised male rectum normal stool guaiac-negative prostate smooth nonnodular 1+ BPH  Extremities normal skin normal peripheral pulses normal  #1 sleep dysfunction........ continue Elavil  #2 ED....... trial Cialis  #3 tobacco abuse........... Chantix program follow-up in September  #4 BPH  #5 history of colon polyp........Marland Kitchen recommended GI follow-up.

## 2017-05-14 LAB — HEPATITIS C ANTIBODY
HEP C AB: NONREACTIVE
SIGNAL TO CUT-OFF: 0.04 (ref <1.00)

## 2017-09-17 ENCOUNTER — Ambulatory Visit: Payer: BC Managed Care – PPO | Admitting: Family Medicine

## 2018-06-03 ENCOUNTER — Telehealth: Payer: Self-pay | Admitting: Family Medicine

## 2018-06-03 NOTE — Telephone Encounter (Signed)
Patient calling Fleet Contras back.

## 2018-06-03 NOTE — Telephone Encounter (Signed)
Attempted to speak with patient, but was placed on hold. Patient will need to schedule a TOC for further refills. CRM

## 2018-06-03 NOTE — Telephone Encounter (Signed)
Copied from CRM [id number]. Topic: Quick Communication - Rx Refill/Question >> Jun 03, 2018  9:48 AM Doreatha Massed wrote: Medication: amitriptyline (ELAVIL) 50 MG tablet  Has the patient contacted their pharmacy? No  (Agent: If no, request that the patient contact the pharmacy for the refill.) pt has no more refills (Agent: If yes, when and what did the pharmacy advise?)  Preferred Pharmacy (with phone number or street name): [facility]  Agent: Please be advised that RX refills may take up to 3 business days. We ask that you follow-up with your pharmacy.

## 2018-06-05 NOTE — Telephone Encounter (Signed)
Left message on machine for patient to return our call to schedule a TOC.

## 2018-06-05 NOTE — Telephone Encounter (Signed)
Virtual visit scheduled.  

## 2018-06-06 ENCOUNTER — Encounter: Payer: Self-pay | Admitting: Internal Medicine

## 2018-06-06 ENCOUNTER — Ambulatory Visit (INDEPENDENT_AMBULATORY_CARE_PROVIDER_SITE_OTHER): Payer: BC Managed Care – PPO | Admitting: Internal Medicine

## 2018-06-06 ENCOUNTER — Other Ambulatory Visit: Payer: Self-pay

## 2018-06-06 DIAGNOSIS — E6609 Other obesity due to excess calories: Secondary | ICD-10-CM | POA: Diagnosis not present

## 2018-06-06 DIAGNOSIS — Z22322 Carrier or suspected carrier of Methicillin resistant Staphylococcus aureus: Secondary | ICD-10-CM

## 2018-06-06 DIAGNOSIS — R351 Nocturia: Secondary | ICD-10-CM

## 2018-06-06 DIAGNOSIS — R6884 Jaw pain: Secondary | ICD-10-CM

## 2018-06-06 DIAGNOSIS — F528 Other sexual dysfunction not due to a substance or known physiological condition: Secondary | ICD-10-CM | POA: Diagnosis not present

## 2018-06-06 DIAGNOSIS — Z720 Tobacco use: Secondary | ICD-10-CM

## 2018-06-06 DIAGNOSIS — Z6837 Body mass index (BMI) 37.0-37.9, adult: Secondary | ICD-10-CM

## 2018-06-06 DIAGNOSIS — N401 Enlarged prostate with lower urinary tract symptoms: Secondary | ICD-10-CM | POA: Diagnosis not present

## 2018-06-06 MED ORDER — AMITRIPTYLINE HCL 50 MG PO TABS: ORAL_TABLET | ORAL | 4 refills | Status: DC

## 2018-06-06 NOTE — Progress Notes (Signed)
Virtual Visit via Video Note  I connected with Marc Brown on 06/06/18 at 10:30 AM EDT by a video enabled telemedicine application and verified that I am speaking with the correct person using two identifiers.  Location patient: home Location provider: work office Persons participating in the virtual visit: patient, provider  I discussed the limitations of evaluation and management by telemedicine and the availability of in person appointments. The patient expressed understanding and agreed to proceed.   HPI: This is a scheduled visit to establish care, for medication refills and also to follow-up on chronic conditions.  Marc Brown has a past medical history that is significant for:  1.  Hyperlipidemia not on medications.  2.  Obesity  3.  Tobacco abuse, he has been smoking 1 pack a day since his late 30s.  4.  Erectile dysfunction for which he uses 2 tablets of Cialis before sexual activity.  5.  Jaw pain for which he uses amitriptyline and needs a refill for.  6.  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  He is a professor at [employer]; he teaches American sign language and interpreters.  He is homosexual.  His long-term partner Marc Brown was diagnosed with COVID-19 on 5/27 and [place].  He has currently not displayed any symptoms.  His family history is significant for father who died of a myocardial infarction at age 58 and a mother who died from metastatic melanoma, he sees a dermatologist once a year.   ROS: Constitutional: Denies fever, chills, diaphoresis, appetite change and fatigue.  HEENT: Denies photophobia, eye pain, redness, hearing loss, ear pain, congestion, sore throat, rhinorrhea, sneezing, mouth sores, trouble swallowing, neck pain, neck stiffness and tinnitus.   Respiratory: Denies SOB, DOE, cough, chest tightness,  and wheezing.   Cardiovascular: Denies chest pain, palpitations and leg  swelling.  Gastrointestinal: Denies nausea, vomiting, abdominal pain, diarrhea, constipation, blood in stool and abdominal distention.  Genitourinary: Denies dysuria, urgency, frequency, hematuria, flank pain and difficulty urinating.  Endocrine: Denies: hot or cold intolerance, sweats, changes in hair or nails, polyuria, polydipsia. Musculoskeletal: Denies myalgias, back pain, joint swelling, arthralgias and gait problem.  Skin: Denies pallor, rash and wound.  Neurological: Denies dizziness, seizures, syncope, weakness, light-headedness, numbness and headaches.  Hematological: Denies adenopathy. Easy bruising, personal or family bleeding history  Psychiatric/Behavioral: Denies suicidal ideation, mood changes, confusion, nervousness, sleep disturbance and agitation   Past Medical History:  Diagnosis Date  . ED (erectile dysfunction)   . Hyperlipidemia    denies  . Obese   . Recurrent boils   . Tobacco abuse     Past Surgical History:  Procedure Laterality Date  . CHOLECYSTECTOMY N/A 02/14/2013   Procedure: LAPAROSCOPIC CHOLECYSTECTOMY;  Surgeon: Ernestene MentionHaywood M Ingram, MD;  [facility];  Service: General;  Laterality: N/A;  . COLONOSCOPY    . ELBOW FRACTURE SURGERY Left    left    Family History  Problem Relation Age of Onset  . Heart disease Father   . Obesity Father   . Schizophrenia Father   . Kidney disease Mother   . Hearing loss Mother        due to whooping cough  . Multiple sclerosis Brother   . Hyperlipidemia Brother   . Cancer - Other Sister        cancer of duodeum  . Colon cancer Neg Hx   . Esophageal cancer Neg Hx   . Rectal cancer  Neg Hx   . Stomach cancer Neg Hx     SOCIAL HX:   reports that he has been smoking cigarettes. He has a 20.00 pack-year smoking history. He has never used smokeless tobacco. He reports current alcohol use of about 8.0 standard drinks of alcohol per week. He reports that he does not use drugs.   Current Outpatient Medications:   .  amitriptyline (ELAVIL) 50 MG tablet, TAKE 1 TABLET BY MOUTH EVERYDAY AT BEDTIME, Disp: 90 tablet, Rfl: 4 .  aspirin 81 MG tablet, Take 81 mg by mouth at bedtime. , Disp: , Rfl:  .  fluticasone (FLOVENT HFA) 44 MCG/ACT inhaler, Inhale 1 puff into the lungs 2 (two) times daily., Disp: 2 Inhaler, Rfl: 6 .  mupirocin ointment (BACTROBAN) 2 %, Apply 1 application topically daily., Disp: , Rfl:  .  tadalafil (CIALIS) 20 MG tablet, Take 0.5-1 tablets (10-20 mg total) by mouth every other day as needed for erectile dysfunction., Disp: 10 tablet, Rfl: 11  EXAM:   VITALS per patient if applicable: None reported  GENERAL: alert, oriented, appears well and in no acute distress  HEENT: atraumatic, conjunttiva clear, no obvious abnormalities on inspection of external nose and ears, wears corrective lenses  NECK: normal movements of the head and neck  LUNGS: on inspection no signs of respiratory distress, breathing rate appears normal, no obvious gross increased work of breathing, gasping or wheezing  CV: no obvious cyanosis  MS: moves all visible extremities without noticeable abnormality  PSYCH/NEURO: pleasant and cooperative, no obvious depression or anxiety, speech and thought processing grossly intact  ASSESSMENT AND PLAN:   Class 2 obesity due to excess calories without serious comorbidity with body mass index (BMI) of 37.0 to 37.9 in adult -Discussed healthy lifestyle, including increased physical activity and better food choices to promote weight loss.  ERECTILE DYSFUNCTION -Continue Cialis 2 tablets prior to sexual activity.  Tobacco abuse -I have discussed tobacco cessation with the patient.  I have counseled the patient regarding the negative impacts of continued tobacco use including but not limited to lung cancer, COPD, and cardiovascular disease.  I have discussed alternatives to tobacco and modalities that may help facilitate tobacco cessation including but not limited to  biofeedback, hypnosis, and medications.  Total time spent with tobacco counseling was 4 minutes. -He remains in the pre-contemplative stage.  BPH associated with nocturia -He only wakes up once or twice a night. -Not currently on medications.  MRSA colonization -On Bactroban  Jaw pain  -Etiology not clear, however has been on Bactroban for years and seems to work for him, will refill today.     I discussed the assessment and treatment plan with the patient. The patient was provided an opportunity to ask questions and all were answered. The patient agreed with the plan and demonstrated an understanding of the instructions.   The patient was advised to call back or seek an in-person evaluation if the symptoms worsen or if the condition fails to improve as anticipated.    [name], MD  [facility]

## 2019-01-19 ENCOUNTER — Other Ambulatory Visit: Payer: Self-pay

## 2019-01-19 ENCOUNTER — Ambulatory Visit: Payer: BC Managed Care – PPO | Attending: Internal Medicine

## 2019-01-19 DIAGNOSIS — Z20822 Contact with and (suspected) exposure to covid-19: Secondary | ICD-10-CM

## 2019-01-20 LAB — NOVEL CORONAVIRUS, NAA: SARS-CoV-2, NAA: NOT DETECTED

## 2019-08-30 ENCOUNTER — Other Ambulatory Visit: Payer: Self-pay | Admitting: Internal Medicine

## 2019-08-30 DIAGNOSIS — R6884 Jaw pain: Secondary | ICD-10-CM

## 2019-12-17 ENCOUNTER — Other Ambulatory Visit: Payer: Self-pay

## 2019-12-17 ENCOUNTER — Ambulatory Visit (INDEPENDENT_AMBULATORY_CARE_PROVIDER_SITE_OTHER): Payer: BC Managed Care – PPO | Admitting: Internal Medicine

## 2019-12-17 ENCOUNTER — Encounter: Payer: Self-pay | Admitting: Internal Medicine

## 2019-12-17 VITALS — BP 110/70 | HR 85 | Temp 98.1°F | Ht 72.0 in | Wt 249.1 lb

## 2019-12-17 DIAGNOSIS — Z23 Encounter for immunization: Secondary | ICD-10-CM | POA: Diagnosis not present

## 2019-12-17 DIAGNOSIS — Z86010 Personal history of colonic polyps: Secondary | ICD-10-CM

## 2019-12-17 DIAGNOSIS — Z1211 Encounter for screening for malignant neoplasm of colon: Secondary | ICD-10-CM | POA: Diagnosis not present

## 2019-12-17 DIAGNOSIS — F528 Other sexual dysfunction not due to a substance or known physiological condition: Secondary | ICD-10-CM

## 2019-12-17 DIAGNOSIS — Z0000 Encounter for general adult medical examination without abnormal findings: Secondary | ICD-10-CM | POA: Diagnosis not present

## 2019-12-17 DIAGNOSIS — Z6833 Body mass index (BMI) 33.0-33.9, adult: Secondary | ICD-10-CM

## 2019-12-17 DIAGNOSIS — E6609 Other obesity due to excess calories: Secondary | ICD-10-CM

## 2019-12-17 DIAGNOSIS — F17210 Nicotine dependence, cigarettes, uncomplicated: Secondary | ICD-10-CM

## 2019-12-17 MED ORDER — SILDENAFIL CITRATE 100 MG PO TABS: 50.0000 mg | ORAL_TABLET | Freq: Every day | ORAL | 11 refills | Status: DC | PRN

## 2019-12-17 NOTE — Progress Notes (Signed)
Established Patient Office Visit     This visit occurred during the SARS-CoV-2 public health emergency.  Safety protocols were in place, including screening questions prior to the visit, additional usage of staff PPE, and extensive cleaning of exam room while observing appropriate contact time as indicated for disinfecting solutions.    CC/Reason for Visit: Annual preventive exam  HPI: Marc Brown is a 59 y.o. male who is coming in today for the above mentioned reasons. Past Medical History is significant for: Obesity with an approximate 30 pound weight loss in the last year, ongoing tobacco use of about a pack a day.  He has routine eye and dental care, he walks at a slow pace for about 20 minutes a day.  He has completed his Covid vaccination series, flu and Tdap are updated, he is due for a Pneumovax update today, also due for shingles.  He is due for colonoscopy.  He complains of significant ED, has tried Cialis without much relief.  His father had an MI at the age of 62, he was also a heavy smoker.  He sees dermatologist once a year screening, mother had malignant melanoma.   Past Medical/Surgical History: Past Medical History:  Diagnosis Date  . ED (erectile dysfunction)   . Hyperlipidemia    denies  . Obese   . Recurrent boils   . Tobacco abuse     Past Surgical History:  Procedure Laterality Date  . CHOLECYSTECTOMY N/A 02/14/2013   Procedure: LAPAROSCOPIC CHOLECYSTECTOMY;  Surgeon: Adin Hector, MD;  Location: [place] [facility];  Service: General;  Laterality: N/A;  . COLONOSCOPY    . ELBOW FRACTURE SURGERY Left    left    Social History:  reports that he has been smoking cigarettes. He has a 20.00 pack-year smoking history. He has never used smokeless tobacco. He reports current alcohol use of about 8.0 standard drinks of alcohol per week. He reports that he does not use drugs.  Allergies: No Known Allergies  Family History:  Family History  Problem Relation Age  of Onset  . Heart disease Father   . Obesity Father   . Schizophrenia Father   . Kidney disease Mother   . Hearing loss Mother        due to whooping cough  . Multiple sclerosis Brother   . Hyperlipidemia Brother   . Cancer - Other Sister        cancer of duodeum  . Colon cancer Neg Hx   . Esophageal cancer Neg Hx   . Rectal cancer Neg Hx   . Stomach cancer Neg Hx      Current Outpatient Medications:  .  amitriptyline (ELAVIL) 50 MG tablet, TAKE 1 TABLET BY MOUTH EVERYDAY AT BEDTIME, Disp: 90 tablet, Rfl: 4 .  aspirin 81 MG tablet, Take 81 mg by mouth at bedtime., Disp: , Rfl:  .  fluticasone (FLOVENT HFA) 44 MCG/ACT inhaler, Inhale 1 puff into the lungs 2 (two) times daily., Disp: 2 Inhaler, Rfl: 6 .  mupirocin ointment (BACTROBAN) 2 %, Apply 1 application topically daily., Disp: , Rfl:  .  sildenafil (VIAGRA) 100 MG tablet, Take 0.5-1 tablets (50-100 mg total) by mouth daily as needed for erectile dysfunction., Disp: 5 tablet, Rfl: 11  Review of Systems:  Constitutional: Denies fever, chills, diaphoresis, appetite change and fatigue.  HEENT: Denies photophobia, eye pain, redness, hearing loss, ear pain, congestion, sore throat, rhinorrhea, sneezing, mouth sores, trouble swallowing, neck pain, neck stiffness and tinnitus.  Respiratory: Denies SOB, DOE, cough, chest tightness,  and wheezing.   Cardiovascular: Denies chest pain, palpitations and leg swelling.  Gastrointestinal: Denies nausea, vomiting, abdominal pain, diarrhea, constipation, blood in stool and abdominal distention.  Genitourinary: Denies dysuria, urgency, frequency, hematuria, flank pain and difficulty urinating.  Endocrine: Denies: hot or cold intolerance, sweats, changes in hair or nails, polyuria, polydipsia. Musculoskeletal: Denies myalgias, back pain, joint swelling, arthralgias and gait problem.  Skin: Denies pallor, rash and wound.  Neurological: Denies dizziness, seizures, syncope, weakness,  light-headedness, numbness and headaches.  Hematological: Denies adenopathy. Easy bruising, personal or family bleeding history  Psychiatric/Behavioral: Denies suicidal ideation, mood changes, confusion, nervousness, sleep disturbance and agitation    Physical Exam: Vitals:   12/17/19 0758  BP: 110/70  Pulse: 85  Temp: 98.1 F (36.7 C)  TempSrc: Oral  SpO2: 97%  Weight: 249 lb 1.6 oz (113 kg)  Height: 6' (1.829 m)    Body mass index is 33.78 kg/m.   Constitutional: NAD, calm, comfortable Eyes: PERRL, lids and conjunctivae normal, wears corrective lenses ENMT: Mucous membranes are moist. Posterior pharynx clear of any exudate or lesions. Normal dentition. Tympanic membrane is pearly white, no erythema or bulging. Neck: normal, supple, no masses, no thyromegaly Respiratory: clear to auscultation bilaterally, no wheezing, no crackles. Normal respiratory effort. No accessory muscle use.  Cardiovascular: Regular rate and rhythm, no murmurs / rubs / gallops. No extremity edema.  Abdomen: no tenderness, no masses palpated. No hepatosplenomegaly. Bowel sounds positive.  Musculoskeletal: no clubbing / cyanosis. No joint deformity upper and lower extremities. Good ROM, no contractures. Normal muscle tone.  Skin: no rashes, lesions, ulcers. No induration Neurologic: CN 2-12 grossly intact. Sensation intact, DTR normal. Strength 5/5 in all 4.  Psychiatric: Normal judgment and insight. Alert and oriented x 3. Normal mood.    Impression and Plan:  Encounter for preventive health examination  -He has routine eye and dental care. -PPSV23 today, he is also due to start shingles vaccination series, otherwise immunizations are up-to-date and age-appropriate. -Screening labs today. -Healthy lifestyle discussed in detail including need to increase activity to 30 minutes 3 days a week of moderate intensity aerobic activity. -PSA today for prostate cancer screening. -GI referral to be placed for  colonoscopy. -We discussed the importance of preexposure prophylaxis for HIV for homosexual men, he will consider and discuss with his partner if he would like to start Truvada or Descovy. -He has annual dermatology exams for screening.  Hx of colonic polyp  Screening for colon cancer  - Plan: Ambulatory referral to Gastroenterology  ERECTILE DYSFUNCTION  - Plan: sildenafil (VIAGRA) 100 MG tablet  Cigarette nicotine dependence without complication -I have discussed tobacco cessation with the patient.  I have counseled the patient regarding the negative impacts of continued tobacco use including but not limited to lung cancer, COPD, and cardiovascular disease.  I have discussed alternatives to tobacco and modalities that may help facilitate tobacco cessation including but not limited to biofeedback, hypnosis, and medications.  Total time spent with tobacco counseling was 3 minutes. -He remains in the precontemplative stage. -I will continue to address this with him at subsequent visits.  Class 1 obesity due to excess calories without serious comorbidity with body mass index (BMI) of 33.0 to 33.9 in adult -Discussed healthy lifestyle, including increased physical activity and better food choices to promote weight loss. -He has been congratulated on his weight loss thus far.  Need for Streptococcus pneumoniae vaccination -PPSV23 administered today.    Patient  Instructions  -Nice seeing you today!!  -Lab work today; will notify you once results are available.  -Pneumonia vaccine today.  -Remember your shingles vaccine at the pharmacy.  -Schedule follow up in 1 year or sooner as needed.   Preventive Care 20-38 Years Old, Male Preventive care refers to lifestyle choices and visits with your health care provider that can promote health and wellness. This includes:  A yearly physical exam. This is also called an annual well check.  Regular dental and eye  exams.  Immunizations.  Screening for certain conditions.  Healthy lifestyle choices, such as eating a healthy diet, getting regular exercise, not using drugs or products that contain nicotine and tobacco, and limiting alcohol use. What can I expect for my preventive care visit? Physical exam Your health care provider will check:  Height and weight. These may be used to calculate body mass index (BMI), which is a measurement that tells if you are at a healthy weight.  Heart rate and blood pressure.  Your skin for abnormal spots. Counseling Your health care provider may ask you questions about:  Alcohol, tobacco, and drug use.  Emotional well-being.  Home and relationship well-being.  Sexual activity.  Eating habits.  Work and work Statistician. What immunizations do I need?  Influenza (flu) vaccine  This is recommended every year. Tetanus, diphtheria, and pertussis (Tdap) vaccine  You may need a Td booster every 10 years. Varicella (chickenpox) vaccine  You may need this vaccine if you have not already been vaccinated. Zoster (shingles) vaccine  You may need this after age 49. Measles, mumps, and rubella (MMR) vaccine  You may need at least one dose of MMR if you were born in 1957 or later. You may also need a second dose. Pneumococcal conjugate (PCV13) vaccine  You may need this if you have certain conditions and were not previously vaccinated. Pneumococcal polysaccharide (PPSV23) vaccine  You may need one or two doses if you smoke cigarettes or if you have certain conditions. Meningococcal conjugate (MenACWY) vaccine  You may need this if you have certain conditions. Hepatitis A vaccine  You may need this if you have certain conditions or if you travel or work in places where you may be exposed to hepatitis A. Hepatitis B vaccine  You may need this if you have certain conditions or if you travel or work in places where you may be exposed to hepatitis  B. Haemophilus influenzae type b (Hib) vaccine  You may need this if you have certain risk factors. Human papillomavirus (HPV) vaccine  If recommended by your health care provider, you may need three doses over 6 months. You may receive vaccines as individual doses or as more than one vaccine together in one shot (combination vaccines). Talk with your health care provider about the risks and benefits of combination vaccines. What tests do I need? Blood tests  Lipid and cholesterol levels. These may be checked every 5 years, or more frequently if you are over 82 years old.  Hepatitis C test.  Hepatitis B test. Screening  Lung cancer screening. You may have this screening every year starting at age 16 if you have a 30-pack-year history of smoking and currently smoke or have quit within the past 15 years.  Prostate cancer screening. Recommendations will vary depending on your family history and other risks.  Colorectal cancer screening. All adults should have this screening starting at age 72 and continuing until age 61. Your health care provider may recommend screening  at age 4 if you are at increased risk. You will have tests every 1-10 years, depending on your results and the type of screening test.  Diabetes screening. This is done by checking your blood sugar (glucose) after you have not eaten for a while (fasting). You may have this done every 1-3 years.  Sexually transmitted disease (STD) testing. Follow these instructions at home: Eating and drinking  Eat a diet that includes fresh fruits and vegetables, whole grains, lean protein, and low-fat dairy products.  Take vitamin and mineral supplements as recommended by your health care provider.  Do not drink alcohol if your health care provider tells you not to drink.  If you drink alcohol: ? Limit how much you have to 0-2 drinks a day. ? Be aware of how much alcohol is in your drink. In the [place] one drink equals one 12 oz  bottle of beer (355 mL), one 5 oz glass of wine (148 mL), or one 1 oz glass of hard liquor (44 mL). Lifestyle  Take daily care of your teeth and gums.  Stay active. Exercise for at least 30 minutes on 5 or more days each week.  Do not use any products that contain nicotine or tobacco, such as cigarettes, e-cigarettes, and chewing tobacco. If you need help quitting, ask your health care provider.  If you are sexually active, practice safe sex. Use a condom or other form of protection to prevent STIs (sexually transmitted infections).  Talk with your health care provider about taking a low-dose aspirin every day starting at age 72. What's next?  Go to your health care provider once a year for a well check visit.  Ask your health care provider how often you should have your eyes and teeth checked.  Stay up to date on all vaccines. This information is not intended to replace advice given to you by your health care provider. Make sure you discuss any questions you have with your health care provider. Document Revised: 12/12/2017 Document Reviewed: 12/12/2017 Elsevier Patient Education  2020 [place], [place] [facility]

## 2019-12-17 NOTE — Patient Instructions (Signed)
-Nice seeing you today!!  -Lab work today; will notify you once results are available.  -Pneumonia vaccine today.  -Remember your shingles vaccine at the pharmacy.  -Schedule follow up in 1 year or sooner as needed.   Preventive Care 53-59 Years Old, Male Preventive care refers to lifestyle choices and visits with your health care provider that can promote health and wellness. This includes:  A yearly physical exam. This is also called an annual well check.  Regular dental and eye exams.  Immunizations.  Screening for certain conditions.  Healthy lifestyle choices, such as eating a healthy diet, getting regular exercise, not using drugs or products that contain nicotine and tobacco, and limiting alcohol use. What can I expect for my preventive care visit? Physical exam Your health care provider will check:  Height and weight. These may be used to calculate body mass index (BMI), which is a measurement that tells if you are at a healthy weight.  Heart rate and blood pressure.  Your skin for abnormal spots. Counseling Your health care provider may ask you questions about:  Alcohol, tobacco, and drug use.  Emotional well-being.  Home and relationship well-being.  Sexual activity.  Eating habits.  Work and work Statistician. What immunizations do I need?  Influenza (flu) vaccine  This is recommended every year. Tetanus, diphtheria, and pertussis (Tdap) vaccine  You may need a Td booster every 10 years. Varicella (chickenpox) vaccine  You may need this vaccine if you have not already been vaccinated. Zoster (shingles) vaccine  You may need this after age 14. Measles, mumps, and rubella (MMR) vaccine  You may need at least one dose of MMR if you were born in 1957 or later. You may also need a second dose. Pneumococcal conjugate (PCV13) vaccine  You may need this if you have certain conditions and were not previously vaccinated. Pneumococcal polysaccharide  (PPSV23) vaccine  You may need one or two doses if you smoke cigarettes or if you have certain conditions. Meningococcal conjugate (MenACWY) vaccine  You may need this if you have certain conditions. Hepatitis A vaccine  You may need this if you have certain conditions or if you travel or work in places where you may be exposed to hepatitis A. Hepatitis B vaccine  You may need this if you have certain conditions or if you travel or work in places where you may be exposed to hepatitis B. Haemophilus influenzae type b (Hib) vaccine  You may need this if you have certain risk factors. Human papillomavirus (HPV) vaccine  If recommended by your health care provider, you may need three doses over 6 months. You may receive vaccines as individual doses or as more than one vaccine together in one shot (combination vaccines). Talk with your health care provider about the risks and benefits of combination vaccines. What tests do I need? Blood tests  Lipid and cholesterol levels. These may be checked every 5 years, or more frequently if you are over 37 years old.  Hepatitis C test.  Hepatitis B test. Screening  Lung cancer screening. You may have this screening every year starting at age 59 if you have a 30-pack-year history of smoking and currently smoke or have quit within the past 15 years.  Prostate cancer screening. Recommendations will vary depending on your family history and other risks.  Colorectal cancer screening. All adults should have this screening starting at age 59 and continuing until age 16. Your health care provider may recommend screening at age 14  if you are at increased risk. You will have tests every 1-10 years, depending on your results and the type of screening test.  Diabetes screening. This is done by checking your blood sugar (glucose) after you have not eaten for a while (fasting). You may have this done every 1-3 years.  Sexually transmitted disease (STD)  testing. Follow these instructions at home: Eating and drinking  Eat a diet that includes fresh fruits and vegetables, whole grains, lean protein, and low-fat dairy products.  Take vitamin and mineral supplements as recommended by your health care provider.  Do not drink alcohol if your health care provider tells you not to drink.  If you drink alcohol: ? Limit how much you have to 0-2 drinks a day. ? Be aware of how much alcohol is in your drink. In the [place] one drink equals one 12 oz bottle of beer (355 mL), one 5 oz glass of wine (148 mL), or one 1 oz glass of hard liquor (44 mL). Lifestyle  Take daily care of your teeth and gums.  Stay active. Exercise for at least 30 minutes on 5 or more days each week.  Do not use any products that contain nicotine or tobacco, such as cigarettes, e-cigarettes, and chewing tobacco. If you need help quitting, ask your health care provider.  If you are sexually active, practice safe sex. Use a condom or other form of protection to prevent STIs (sexually transmitted infections).  Talk with your health care provider about taking a low-dose aspirin every day starting at age 19. What's next?  Go to your health care provider once a year for a well check visit.  Ask your health care provider how often you should have your eyes and teeth checked.  Stay up to date on all vaccines. This information is not intended to replace advice given to you by your health care provider. Make sure you discuss any questions you have with your health care provider. Document Revised: 12/12/2017 Document Reviewed: 12/12/2017 Elsevier Patient Education  2020 [employer].

## 2019-12-17 NOTE — Addendum Note (Signed)
Addended by: Kern Reap B on: 12/17/2019 05:20 PM   Modules accepted: Orders

## 2019-12-18 ENCOUNTER — Other Ambulatory Visit: Payer: Self-pay | Admitting: Internal Medicine

## 2019-12-18 ENCOUNTER — Encounter: Payer: Self-pay | Admitting: Internal Medicine

## 2019-12-18 DIAGNOSIS — E785 Hyperlipidemia, unspecified: Secondary | ICD-10-CM

## 2019-12-18 DIAGNOSIS — E559 Vitamin D deficiency, unspecified: Secondary | ICD-10-CM

## 2019-12-18 LAB — COMPREHENSIVE METABOLIC PANEL
AG Ratio: 1.8 (calc) (ref 1.0–2.5)
ALT: 15 U/L (ref 9–46)
AST: 14 U/L (ref 10–35)
Albumin: 4.2 g/dL (ref 3.6–5.1)
Alkaline phosphatase (APISO): 71 U/L (ref 35–144)
BUN: 18 mg/dL (ref 7–25)
CO2: 25 mmol/L (ref 20–32)
Calcium: 8.9 mg/dL (ref 8.6–10.3)
Chloride: 107 mmol/L (ref 98–110)
Creat: 0.84 mg/dL (ref 0.70–1.33)
Globulin: 2.4 g/dL (calc) (ref 1.9–3.7)
Glucose, Bld: 98 mg/dL (ref 65–99)
Potassium: 4.8 mmol/L (ref 3.5–5.3)
Sodium: 138 mmol/L (ref 135–146)
Total Bilirubin: 0.4 mg/dL (ref 0.2–1.2)
Total Protein: 6.6 g/dL (ref 6.1–8.1)

## 2019-12-18 LAB — CBC WITH DIFFERENTIAL/PLATELET
Absolute Monocytes: 558 cells/uL (ref 200–950)
Basophils Absolute: 41 cells/uL (ref 0–200)
Basophils Relative: 0.6 %
Eosinophils Absolute: 156 cells/uL (ref 15–500)
Eosinophils Relative: 2.3 %
HCT: 49.9 % (ref 38.5–50.0)
Hemoglobin: 16.9 g/dL (ref 13.2–17.1)
Lymphs Abs: 2196 cells/uL (ref 850–3900)
MCH: 30 pg (ref 27.0–33.0)
MCHC: 33.9 g/dL (ref 32.0–36.0)
MCV: 88.6 fL (ref 80.0–100.0)
MPV: 9.5 fL (ref 7.5–12.5)
Monocytes Relative: 8.2 %
Neutro Abs: 3849 cells/uL (ref 1500–7800)
Neutrophils Relative %: 56.6 %
Platelets: 276 10*3/uL (ref 140–400)
RBC: 5.63 10*6/uL (ref 4.20–5.80)
RDW: 12.3 % (ref 11.0–15.0)
Total Lymphocyte: 32.3 %
WBC: 6.8 10*3/uL (ref 3.8–10.8)

## 2019-12-18 LAB — PSA: PSA: 1.21 ng/mL (ref <=4.0)

## 2019-12-18 LAB — TSH: TSH: 2.02 mIU/L (ref 0.40–4.50)

## 2019-12-18 LAB — HEMOGLOBIN A1C
Hgb A1c MFr Bld: 5.5 % of total Hgb (ref <5.7)
Mean Plasma Glucose: 111 mg/dL
eAG (mmol/L): 6.2 mmol/L

## 2019-12-18 LAB — LIPID PANEL
Cholesterol: 189 mg/dL (ref <200)
HDL: 39 mg/dL — ABNORMAL LOW (ref >=40)
LDL Cholesterol (Calc): 131 mg/dL (calc) — ABNORMAL HIGH
Non-HDL Cholesterol (Calc): 150 mg/dL (calc) — ABNORMAL HIGH (ref <130)
Total CHOL/HDL Ratio: 4.8 (calc) (ref <5.0)
Triglycerides: 89 mg/dL (ref <150)

## 2019-12-18 LAB — VITAMIN B12: Vitamin B-12: 298 pg/mL (ref 200–1100)

## 2019-12-18 LAB — VITAMIN D 25 HYDROXY (VIT D DEFICIENCY, FRACTURES): Vit D, 25-Hydroxy: 24 ng/mL — ABNORMAL LOW (ref 30–100)

## 2019-12-18 MED ORDER — VITAMIN D (ERGOCALCIFEROL) 1.25 MG (50000 UNIT) PO CAPS: 50000.0000 [IU] | ORAL_CAPSULE | ORAL | 0 refills | Status: DC

## 2020-03-03 ENCOUNTER — Other Ambulatory Visit: Payer: Self-pay | Admitting: Internal Medicine

## 2020-03-03 DIAGNOSIS — E559 Vitamin D deficiency, unspecified: Secondary | ICD-10-CM

## 2020-05-22 ENCOUNTER — Other Ambulatory Visit: Payer: Self-pay | Admitting: Internal Medicine

## 2020-05-22 DIAGNOSIS — E559 Vitamin D deficiency, unspecified: Secondary | ICD-10-CM

## 2020-05-23 ENCOUNTER — Encounter: Payer: Self-pay | Admitting: Podiatry

## 2020-05-23 ENCOUNTER — Ambulatory Visit: Payer: BC Managed Care – PPO | Admitting: Podiatry

## 2020-05-23 ENCOUNTER — Other Ambulatory Visit: Payer: Self-pay

## 2020-05-23 ENCOUNTER — Ambulatory Visit (INDEPENDENT_AMBULATORY_CARE_PROVIDER_SITE_OTHER): Payer: BC Managed Care – PPO

## 2020-05-23 DIAGNOSIS — M779 Enthesopathy, unspecified: Secondary | ICD-10-CM | POA: Diagnosis not present

## 2020-05-23 DIAGNOSIS — M21619 Bunion of unspecified foot: Secondary | ICD-10-CM | POA: Diagnosis not present

## 2020-05-23 DIAGNOSIS — L84 Corns and callosities: Secondary | ICD-10-CM | POA: Diagnosis not present

## 2020-05-23 NOTE — Progress Notes (Signed)
Subjective:   Patient ID: Marc Brown, male   DOB: 60 y.o.   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  Patient does smoke 1 pack/day and tries to be active but the pain does bother him and he uses over-the-counter medications   Review of Systems  All other systems reviewed and are negative.       Objective:  Physical Exam Vitals and nursing note reviewed.  Constitutional:      Appearance: He is well-developed.  Pulmonary:     Effort: Pulmonary effort is normal.  Musculoskeletal:        General: Normal range of motion.  Skin:    General: Skin is warm.  Neurological:     Mental Status: He is alert.     Neurovascular status intact muscle strength adequate range of motion adequate patient found to have moderate collapse medial longitudinal arch left structural bunion deformity left with redness around the first metatarsal head keratotic lesions of the first metatarsal fifth metatarsal left with mild discomfort also noted right not to same degree.  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  Today I have recommended orthotics is casted for functional orthotics I debrided lesions which will be continued and may require other treatment depending on response.  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

## 2020-05-23 NOTE — Patient Instructions (Signed)
Bunion A bunion (hallux valgus) is a bump that forms slowly on the inner side of the big toe joint. It occurs when the big toe turns toward the second toe. Bunions may be small at first, but they often get larger over time. They can make walking painful. What are the causes? This condition may be caused by:  Wearing narrow or pointed shoes that force the big toe to press against the other toes.  Abnormal foot development that causes the foot to roll inward.  Changes in the foot that are caused by certain diseases, such as rheumatoid arthritis or polio.  A foot injury. What increases the risk? The following factors may make you more likely to develop this condition:  Wearing shoes that squeeze the toes together.  Having certain diseases, such as: ? Rheumatoid arthritis. ? Polio. ? Cerebral palsy.  Having family members who have bunions.  Being born with abnormally shaped feet (a foot deformity), such as flat feet or low arches.  Doing activities that put a lot of pressure on the feet, such as ballet dancing. What are the signs or symptoms? The main symptom of this condition is a bump on your big toe that you can notice. Other symptoms may include:  Pain.  Redness and inflammation around your big toe.  Thick or hardened skin on your big toe or between your toes.  Stiffness or loss of motion in your big toe.  Trouble with walking.   How is this diagnosed? This condition may be diagnosed based on your symptoms, medical history, and activities. You may also have tests and imaging, such as:  X-rays. These allow your health care provider to check the position of the bones in your foot and look for damage to your joint. They also help your health care provider determine the severity of your bunion and the best way to treat it.  Joint aspiration. In this test, a sample of fluid is removed from the toe joint. This test may be done if you are in a lot of pain. It helps rule out  diseases that cause painful swelling of the joints, such as arthritis or gout. How is this treated? Treatment depends on the severity of your symptoms. The goal of treatment is to relieve symptoms and prevent your bunion from getting worse. Your health care provider may recommend:  Wearing shoes that have a wide toe box, or using bunion pads to cushion the affected area.  Taping your toes together to keep them in a normal position.  Placing a device inside your shoe (orthotic device) to help reduce pressure on your toe joint.  Taking medicine to ease pain and inflammation.  Putting ice or heat on the affected area.  Doing stretching exercises.  Surgery, for severe cases. Follow these instructions at home: Managing pain, stiffness, and swelling  If directed, put ice on the painful area. To do this: ? Put ice in a plastic bag. ? Place a towel between your skin and the bag. ? Leave the ice on for 20 minutes, 2-3 times a day. ? Remove the ice if your skin turns bright red. This is very important. If you cannot feel pain, heat, or cold, you have a greater risk of damage to the area.  If directed, apply heat to the affected area before you exercise. Use the heat source that your health care provider recommends, such as a moist heat pack or a heating pad. ? Place a towel between your skin and the   heat source. ? Leave the heat on for 20-30 minutes. ? Remove the heat if your skin turns bright red. This is especially important if you are unable to feel pain, heat, or cold. You have a greater risk of getting burned.      General instructions  Do exercises as told by your health care provider.  Support your toe joint with proper footwear, shoe padding, or taping as told by your health care provider.  Take over-the-counter and prescription medicines only as told by your health care provider.  Do not use any products that contain nicotine or tobacco, such as cigarettes, e-cigarettes, and  chewing tobacco. If you need help quitting, ask your health care provider.  Keep all follow-up visits. This is important. Contact a health care provider if:  Your symptoms get worse.  Your symptoms do not improve in 2 weeks. Get help right away if:  You have severe pain and trouble with walking. Summary  A bunion is a bump on the inner side of the big toe joint that forms when the big toe turns toward the second toe.  Bunions can make walking painful.  Treatment depends on the severity of your symptoms.  Support your toe joint with proper footwear, shoe padding, or taping as told by your health care provider. This information is not intended to replace advice given to you by your health care provider. Make sure you discuss any questions you have with your health care provider. Document Revised: 04/24/2019 Document Reviewed: 04/24/2019 Elsevier Patient Education  2021 Elsevier Inc.  

## 2020-06-06 ENCOUNTER — Telehealth: Payer: Self-pay | Admitting: Podiatry

## 2020-06-06 NOTE — Telephone Encounter (Signed)
Orthotics in and pts partner Varney Daily is to pick them up.

## 2020-06-28 ENCOUNTER — Encounter: Payer: Self-pay | Admitting: Internal Medicine

## 2020-06-30 ENCOUNTER — Encounter: Payer: Self-pay | Admitting: Internal Medicine

## 2020-07-05 ENCOUNTER — Other Ambulatory Visit: Payer: Self-pay

## 2020-07-06 ENCOUNTER — Other Ambulatory Visit (INDEPENDENT_AMBULATORY_CARE_PROVIDER_SITE_OTHER): Payer: BC Managed Care – PPO

## 2020-07-06 DIAGNOSIS — E559 Vitamin D deficiency, unspecified: Secondary | ICD-10-CM

## 2020-07-06 DIAGNOSIS — E785 Hyperlipidemia, unspecified: Secondary | ICD-10-CM

## 2020-07-06 LAB — LIPID PANEL
Cholesterol: 188 mg/dL (ref 0–200)
HDL: 35.6 mg/dL — ABNORMAL LOW (ref >39.00)
LDL Cholesterol: 137 mg/dL — ABNORMAL HIGH (ref 0–99)
NonHDL: 152.58
Total CHOL/HDL Ratio: 5
Triglycerides: 76 mg/dL (ref 0.0–149.0)
VLDL: 15.2 mg/dL (ref 0.0–40.0)

## 2020-07-06 LAB — VITAMIN D 25 HYDROXY (VIT D DEFICIENCY, FRACTURES): VITD: 25.19 ng/mL — ABNORMAL LOW (ref 30.00–100.00)

## 2020-07-06 NOTE — Addendum Note (Signed)
Addended by: Kandra Nicolas on: 07/06/2020 08:25 AM   Modules accepted: Orders

## 2020-07-08 ENCOUNTER — Other Ambulatory Visit: Payer: Self-pay | Admitting: Internal Medicine

## 2020-07-08 DIAGNOSIS — E782 Mixed hyperlipidemia: Secondary | ICD-10-CM

## 2020-07-08 DIAGNOSIS — E559 Vitamin D deficiency, unspecified: Secondary | ICD-10-CM

## 2020-07-08 MED ORDER — ATORVASTATIN CALCIUM 10 MG PO TABS: 10.0000 mg | ORAL_TABLET | Freq: Every day | ORAL | 1 refills | Status: DC

## 2020-07-08 MED ORDER — VITAMIN D (ERGOCALCIFEROL) 1.25 MG (50000 UNIT) PO CAPS: 50000.0000 [IU] | ORAL_CAPSULE | ORAL | 0 refills | Status: DC

## 2020-08-23 ENCOUNTER — Telehealth: Payer: Self-pay | Admitting: Internal Medicine

## 2020-08-23 NOTE — Telephone Encounter (Signed)
The patient called and said his visit on 12/16 May 08, 2019 for his physical was coded wrong. He notified the billing office and was told to call Dr. Hardie Shackleton office to get the code change to a Preventive care. And it was coded as a diagnostic for the vitamin D and other lab work that he had. He also has some concerns about the lab work he had on 7/6; he wants to make sure that labs were coded correctly.please contact the patient [phone]

## 2020-09-02 ENCOUNTER — Encounter: Payer: Self-pay | Admitting: Internal Medicine

## 2020-09-23 ENCOUNTER — Encounter: Payer: Self-pay | Admitting: Internal Medicine

## 2020-09-23 ENCOUNTER — Ambulatory Visit (AMBULATORY_SURGERY_CENTER): Payer: BC Managed Care – PPO | Admitting: *Deleted

## 2020-09-23 ENCOUNTER — Other Ambulatory Visit: Payer: Self-pay

## 2020-09-23 VITALS — Ht 72.0 in | Wt 260.0 lb

## 2020-09-23 DIAGNOSIS — Z86010 Personal history of colonic polyps: Secondary | ICD-10-CM

## 2020-09-23 NOTE — Progress Notes (Signed)
Pt verified name, DOB, address and insurance during PV today.  Pt mailed instruction packet of Emmi video, copy of consent form to read and not return, and instructions.  PV completed over the phone.  Pt encouraged to call with questions or issues.  My Chart instructions to pt as well    No egg or soy allergy known to patient  No issues known to pt with past sedation with any surgeries or procedures Patient denies ever being told they had issues or difficulty with intubation  No FH of Malignant Hyperthermia Pt is not on diet pills Pt is not on  home 02  Pt is not on blood thinners  Pt denies issues with constipation  No A fib or A flutter  EMMI video to pt or via MyChart   Pt is fully vaccinated  for Covid   Due to the COVID-19 pandemic we are asking patients to follow certain guidelines.  Pt aware of COVID protocols and [facility] guidelines   

## 2020-10-07 ENCOUNTER — Encounter: Payer: Self-pay | Admitting: Internal Medicine

## 2020-10-07 ENCOUNTER — Other Ambulatory Visit: Payer: Self-pay

## 2020-10-07 ENCOUNTER — Ambulatory Visit (AMBULATORY_SURGERY_CENTER): Payer: BC Managed Care – PPO | Admitting: Internal Medicine

## 2020-10-07 VITALS — BP 123/78 | HR 78 | Temp 97.7°F | Resp 14 | Ht 72.0 in | Wt 270.0 lb

## 2020-10-07 DIAGNOSIS — Z86010 Personal history of colonic polyps: Secondary | ICD-10-CM

## 2020-10-07 MED ORDER — FLEET ENEMA 7-19 GM/118ML RE ENEM
1.0000 | ENEMA | Freq: Once | RECTAL | Status: AC
  Administered 2020-10-07: 1 via RECTAL

## 2020-10-07 MED ORDER — SODIUM CHLORIDE 0.9 % IV SOLN: 500.0000 mL | Freq: Once | INTRAVENOUS | Status: DC

## 2020-10-07 NOTE — Progress Notes (Signed)
[facility] History and Physical   Primary Care Physician:  Philip Aspen, Limmie Patricia, MD   Reason for Procedure:  Hx colon polyps  Plan:    colonoscopy     HPI: Marc Brown is a 60 y.o. male here for colonoscopy w/ hx of colonic polyps x 2 npast last exam 2006   Past Medical History:  Diagnosis Date   ED (erectile dysfunction)    GERD (gastroesophageal reflux disease)    rare   Hx of adenomatous colonic polyps    Hyperlipidemia    denies   Obese    Recurrent boils    Tobacco abuse     Past Surgical History:  Procedure Laterality Date   CHOLECYSTECTOMY N/A 02/14/2013   Procedure: LAPAROSCOPIC CHOLECYSTECTOMY;  Surgeon: Ernestene Mention, MD;  Location: [facility] OR;  Service: General;  Laterality: N/A;   COLONOSCOPY     ELBOW FRACTURE SURGERY Left    left   WISDOM TOOTH EXTRACTION      Prior to Admission medications   Medication Sig Start Date End Date Taking? Authorizing Provider  amitriptyline (ELAVIL) 50 MG tablet TAKE 1 TABLET BY MOUTH EVERYDAY AT BEDTIME 09/02/19  Yes Philip Aspen, Limmie Patricia, MD  aspirin 81 MG tablet Take 81 mg by mouth at bedtime.   Yes [provider]  atorvastatin (LIPITOR) 10 MG tablet Take 1 tablet (10 mg total) by mouth daily. 07/08/20  Yes Philip Aspen, Limmie Patricia, MD  ibuprofen (ADVIL) 200 MG tablet Take 200 mg by mouth every 6 (six) hours as needed.   Yes [provider]  mupirocin ointment (BACTROBAN) 2 % Apply 1 application topically daily. 05/06/14  Yes [provider]  acetaminophen (TYLENOL) 500 MG tablet Take 500 mg by mouth every 6 (six) hours as needed.    [provider]  fluticasone (FLOVENT HFA) 44 MCG/ACT inhaler Inhale 1 puff into the lungs 2 (two) times daily. Patient taking differently: Inhale 1 puff into the lungs 2 (two) times daily. As needed 11/22/15   Roderick Pee, MD  sildenafil (VIAGRA) 100 MG tablet Take 0.5-1 tablets (50-100 mg total) by mouth daily as needed for  erectile dysfunction. 12/17/19   Philip Aspen, Limmie Patricia, MD  Vitamin D, Ergocalciferol, (DRISDOL) 1.25 MG (50000 UNIT) CAPS capsule Take 1 capsule (50,000 Units total) by mouth every 7 (seven) days. 07/08/20   Philip Aspen, Limmie Patricia, MD    Current Outpatient Medications  Medication Sig Dispense Refill   amitriptyline (ELAVIL) 50 MG tablet TAKE 1 TABLET BY MOUTH EVERYDAY AT BEDTIME 90 tablet 4   aspirin 81 MG tablet Take 81 mg by mouth at bedtime.     atorvastatin (LIPITOR) 10 MG tablet Take 1 tablet (10 mg total) by mouth daily. 90 tablet 1   ibuprofen (ADVIL) 200 MG tablet Take 200 mg by mouth every 6 (six) hours as needed.     mupirocin ointment (BACTROBAN) 2 % Apply 1 application topically daily.     acetaminophen (TYLENOL) 500 MG tablet Take 500 mg by mouth every 6 (six) hours as needed.     fluticasone (FLOVENT HFA) 44 MCG/ACT inhaler Inhale 1 puff into the lungs 2 (two) times daily. (Patient taking differently: Inhale 1 puff into the lungs 2 (two) times daily. As needed) 2 Inhaler 6   sildenafil (VIAGRA) 100 MG tablet Take 0.5-1 tablets (50-100 mg total) by mouth daily as needed for erectile dysfunction. 5 tablet 11   Vitamin D, Ergocalciferol, (DRISDOL) 1.25 MG (50000 UNIT) CAPS  capsule Take 1 capsule (50,000 Units total) by mouth every 7 (seven) days. 12 capsule 0   Current Facility-Administered Medications  Medication Dose Route Frequency Provider Last Rate Last Admin   0.9 %  sodium chloride infusion  500 mL Intravenous Once Iva Boop, MD        Allergies as of 10/07/2020   (No Known Allergies)    Family History  Problem Relation Age of Onset   Kidney disease Mother    Hearing loss Mother        due to whooping cough   Heart disease Father    Obesity Father    Schizophrenia Father    Cancer - Other Sister        cancer of duodeum   Multiple sclerosis Brother    Hyperlipidemia Brother    Colon cancer Neg Hx    Esophageal cancer Neg Hx    Rectal cancer Neg  Hx    Stomach cancer Neg Hx    Colon polyps Neg Hx        Review of Systems:  All other review of systems negative except as mentioned in the HPI.  Physical Exam: Vital signs BP 137/76   Pulse 96   Temp 97.7 F (36.5 C)   Ht 6' (1.829 m)   Wt 270 lb (122.5 kg)   SpO2 96%   BMI 36.62 kg/m   General:   Alert,  Well-developed, well-nourished, pleasant and cooperative in NAD Lungs:  Clear throughout to auscultation.   Heart:  Regular rate and rhythm; no murmurs, clicks, rubs,  or gallops. Abdomen:  Soft, nontender and nondistended. Normal bowel sounds.   Neuro/Psych:  Alert and cooperative. Normal mood and affect. A and O x 3   @Starnisha Batrez  , MD, [facility] [phone] (pager) 10/07/2020 9:07 AM@

## 2020-10-07 NOTE — Op Note (Signed)
[facility] Patient Name: Marc Brown Procedure Date: 10/07/2020 8:56 AM MRN: [id number] Endoscopist: Iva Boop , MD Age: 60 Referring MD:  Date of Birth: Apr 23, 1960 Gender: Male Account #: [id number] Procedure:                Colonoscopy Indications:              Surveillance: Personal history of adenomatous                            polyps on last colonoscopy > 5 years ago, Last                            colonoscopy: 2006 Medicines:                Propofol per Anesthesia, Monitored Anesthesia Care Procedure:                Pre-Anesthesia Assessment:                           - Prior to the procedure, a History and Physical                            was performed, and patient medications and                            allergies were reviewed. The patient's tolerance of                            previous anesthesia was also reviewed. The risks                            and benefits of the procedure and the sedation                            options and risks were discussed with the patient.                            All questions were answered, and informed consent                            was obtained. Prior Anticoagulants: The patient has                            taken no previous anticoagulant or antiplatelet                            agents. ASA Grade Assessment: II - A patient with                            mild systemic disease. After reviewing the risks                            and benefits, the patient was deemed in  satisfactory condition to undergo the procedure.                           After obtaining informed consent, the colonoscope                            was passed under direct vision. Throughout the                            procedure, the patient's blood pressure, pulse, and                            oxygen saturations were monitored continuously. The                            Olympus CF-HQ190L  [id number]) Colonoscope was                            introduced through the anus and advanced to the the                            cecum, identified by appendiceal orifice and                            ileocecal valve. The colonoscopy was performed                            without difficulty. The patient tolerated the                            procedure well. The quality of the bowel                            preparation was adequate. The bowel preparation                            used was Miralax via split dose instruction. The                            ileocecal valve, appendiceal orifice, and rectum                            were photographed. Scope In: 9:20:25 AM Scope Out: 9:41:47 AM Scope Withdrawal Time: 0 hours 17 minutes 42 seconds  Total Procedure Duration: 0 hours 21 minutes 22 seconds  Findings:                 The perianal and digital rectal examinations were                            normal. Pertinent negatives include normal prostate                            (size, shape, and consistency).  Multiple diverticula were found in the sigmoid                            colon.                           External and internal hemorrhoids were found.                           The exam was otherwise without abnormality on                            direct and retroflexion views. Complications:            No immediate complications. Estimated Blood Loss:     Estimated blood loss: none. Impression:               - Diverticulosis in the sigmoid colon.                           - External and internal hemorrhoids.                           - The examination was otherwise normal on direct                            and retroflexion views.                           - No specimens collected.                           - Personal history of colonic polyps. diminutive                            adenoma 2006 and a remote hx of polyp before  that Recommendation:           - Patient has a contact number available for                            emergencies. The signs and symptoms of potential                            delayed complications were discussed with the                            patient. Return to normal activities tomorrow.                            Written discharge instructions were provided to the                            patient.                           - Resume previous diet.                           -  Continue present medications.                           - Repeat colonoscopy in 10 years. Iva Boop, MD 10/07/2020 9:46:49 AM This report has been signed electronically.

## 2020-10-07 NOTE — Progress Notes (Signed)
Pt Drowsy. VSS. To PACU, report to RN. No anesthetic complications noted.  

## 2020-10-07 NOTE — Patient Instructions (Addendum)
No polyps or cancer seen.  You do have diverticulosis - thickened muscle rings and pouches in the colon wall. Please read the handout about this condition. Hemorrhoids were also swollen - common after a colonoscopy prep.  Next routine colonoscopy or other screening test in 10 years - 2032.  I appreciate the opportunity to care for you. Iva Boop, MD, FACG   YOU HAD AN ENDOSCOPIC PROCEDURE TODAY AT THE [facility]:   Refer to the procedure report that was given to you for any specific questions about what was found during the examination.  If the procedure report does not answer your questions, please call your gastroenterologist to clarify.  If you requested that your care partner not be given the details of your procedure findings, then the procedure report has been included in a sealed envelope for you to review at your convenience later.  YOU SHOULD EXPECT: Some feelings of bloating in the abdomen. Passage of more gas than usual.  Walking can help get rid of the air that was put into your GI tract during the procedure and reduce the bloating. If you had a lower endoscopy (such as a colonoscopy or flexible sigmoidoscopy) you may notice spotting of blood in your stool or on the toilet paper. If you underwent a bowel prep for your procedure, you may not have a normal bowel movement for a few days.  Please Note:  You might notice some irritation and congestion in your nose or some drainage.  This is from the oxygen used during your procedure.  There is no need for concern and it should clear up in a day or so.  SYMPTOMS TO REPORT IMMEDIATELY:  Following lower endoscopy (colonoscopy or flexible sigmoidoscopy):  Excessive amounts of blood in the stool  Significant tenderness or worsening of abdominal pains  Swelling of the abdomen that is new, acute  Fever of 100F or higher  For urgent or emergent issues, a gastroenterologist can be reached at any hour by calling [phone]  [phone]. Do not use MyChart messaging for urgent concerns.    DIET:  We do recommend a small meal at first, but then you may proceed to your regular diet.  Drink plenty of fluids but you should avoid alcoholic beverages for 24 hours.  ACTIVITY:  You should plan to take it easy for the rest of today and you should NOT DRIVE or use heavy machinery until tomorrow (because of the sedation medicines used during the test).    FOLLOW UP: Our staff will call the number listed on your records 48-72 hours following your procedure to check on you and address any questions or concerns that you may have regarding the information given to you following your procedure. If we do not reach you, we will leave a message.  We will attempt to reach you two times.  During this call, we will ask if you have developed any symptoms of COVID 19. If you develop any symptoms (ie: fever, flu-like symptoms, shortness of breath, cough etc.) before then, please call [phone].  If you test positive for Covid 19 in the 2 weeks post procedure, please call and report this information to [place].    SIGNATURES/CONFIDENTIALITY: You and/or your care partner have signed paperwork which will be entered into your electronic medical record.  These signatures attest to the fact that that the information above on your After Visit Summary has been reviewed and is understood.  Full responsibility of the confidentiality of this discharge information  lies with you and/or your care-partner.  

## 2020-10-07 NOTE — Progress Notes (Signed)
VS- Marc Brown  Pt states he drank entire prep; results are cloudy brown.  No solid stool but unable to see to the bottom of the toilet.  Per Dr. Leone Payor, pt to do fleets enema.  840- pt in bathroom, states he is able to give self fleets enema  850-results tea colored, no stool noted.  Able to see to the bottom of the toilet

## 2020-10-10 ENCOUNTER — Telehealth: Payer: Self-pay

## 2020-10-10 NOTE — Telephone Encounter (Signed)
Patient called asking when he should come in for labs to check vitamin D he finished rx 2 weeks ago he also wants to know when he can get his flu shot he received his Covid booster 10/10.  Patient would like a call back

## 2020-10-11 ENCOUNTER — Telehealth: Payer: Self-pay

## 2020-10-11 NOTE — Telephone Encounter (Signed)
Left a message for the pt to return my call.  

## 2020-10-11 NOTE — Telephone Encounter (Signed)
  Follow up Call-  Call back number 10/07/2020  Post procedure Call Back phone  # [phone]  Permission to leave phone message Yes  Some recent data might be hidden     Patient questions:  Do you have a fever, pain , or abdominal swelling? No. Pain Score  0 *  Have you tolerated food without any problems? Yes.    Have you been able to return to your normal activities? Yes.    Do you have any questions about your discharge instructions: Diet   No. Medications  No. Follow up visit  No.  Do you have questions or concerns about your Care? No.  Actions: * If pain score is 4 or above: No action needed, pain <4.

## 2020-10-11 NOTE — Telephone Encounter (Signed)
Patient returned call and was informed of message and scheduled lab appt for vitamin D

## 2020-10-12 ENCOUNTER — Other Ambulatory Visit (INDEPENDENT_AMBULATORY_CARE_PROVIDER_SITE_OTHER): Payer: BC Managed Care – PPO

## 2020-10-12 ENCOUNTER — Ambulatory Visit (INDEPENDENT_AMBULATORY_CARE_PROVIDER_SITE_OTHER): Payer: BC Managed Care – PPO

## 2020-10-12 ENCOUNTER — Other Ambulatory Visit: Payer: Self-pay

## 2020-10-12 DIAGNOSIS — Z23 Encounter for immunization: Secondary | ICD-10-CM | POA: Diagnosis not present

## 2020-10-12 DIAGNOSIS — E782 Mixed hyperlipidemia: Secondary | ICD-10-CM

## 2020-10-12 DIAGNOSIS — E559 Vitamin D deficiency, unspecified: Secondary | ICD-10-CM

## 2020-10-12 LAB — LIPID PANEL
Cholesterol: 128 mg/dL (ref 0–200)
HDL: 42.1 mg/dL (ref >39.00)
LDL Cholesterol: 67 mg/dL (ref 0–99)
NonHDL: 85.42
Total CHOL/HDL Ratio: 3
Triglycerides: 90 mg/dL (ref 0.0–149.0)
VLDL: 18 mg/dL (ref 0.0–40.0)

## 2020-10-12 LAB — VITAMIN D 25 HYDROXY (VIT D DEFICIENCY, FRACTURES): VITD: 34.12 ng/mL (ref 30.00–100.00)

## 2020-10-17 ENCOUNTER — Encounter: Payer: Self-pay | Admitting: Internal Medicine

## 2020-11-15 ENCOUNTER — Other Ambulatory Visit: Payer: Self-pay | Admitting: Internal Medicine

## 2020-11-15 DIAGNOSIS — R6884 Jaw pain: Secondary | ICD-10-CM

## 2020-12-13 ENCOUNTER — Encounter: Payer: BC Managed Care – PPO | Admitting: Internal Medicine

## 2020-12-29 ENCOUNTER — Other Ambulatory Visit: Payer: Self-pay | Admitting: Internal Medicine

## 2020-12-29 DIAGNOSIS — E782 Mixed hyperlipidemia: Secondary | ICD-10-CM

## 2021-01-12 ENCOUNTER — Encounter: Payer: Self-pay | Admitting: Internal Medicine

## 2021-01-12 ENCOUNTER — Ambulatory Visit (INDEPENDENT_AMBULATORY_CARE_PROVIDER_SITE_OTHER): Payer: BC Managed Care – PPO | Admitting: Internal Medicine

## 2021-01-12 VITALS — BP 128/80 | HR 87 | Temp 98.1°F | Ht 72.0 in | Wt 283.0 lb

## 2021-01-12 DIAGNOSIS — Z0000 Encounter for general adult medical examination without abnormal findings: Secondary | ICD-10-CM

## 2021-01-12 DIAGNOSIS — N401 Enlarged prostate with lower urinary tract symptoms: Secondary | ICD-10-CM

## 2021-01-12 DIAGNOSIS — Z720 Tobacco use: Secondary | ICD-10-CM

## 2021-01-12 DIAGNOSIS — Z23 Encounter for immunization: Secondary | ICD-10-CM | POA: Diagnosis not present

## 2021-01-12 DIAGNOSIS — R351 Nocturia: Secondary | ICD-10-CM

## 2021-01-12 DIAGNOSIS — R7302 Impaired glucose tolerance (oral): Secondary | ICD-10-CM | POA: Insufficient documentation

## 2021-01-12 DIAGNOSIS — E782 Mixed hyperlipidemia: Secondary | ICD-10-CM | POA: Diagnosis not present

## 2021-01-12 DIAGNOSIS — E559 Vitamin D deficiency, unspecified: Secondary | ICD-10-CM

## 2021-01-12 LAB — COMPREHENSIVE METABOLIC PANEL
ALT: 29 U/L (ref 0–53)
AST: 21 U/L (ref 0–37)
Albumin: 4.3 g/dL (ref 3.5–5.2)
Alkaline Phosphatase: 64 U/L (ref 39–117)
BUN: 16 mg/dL (ref 6–23)
CO2: 26 mEq/L (ref 19–32)
Calcium: 8.9 mg/dL (ref 8.4–10.5)
Chloride: 103 mEq/L (ref 96–112)
Creatinine, Ser: 0.9 mg/dL (ref 0.40–1.50)
GFR: 92.79 mL/min (ref >60.00)
Glucose, Bld: 86 mg/dL (ref 70–99)
Potassium: 4.5 mEq/L (ref 3.5–5.1)
Sodium: 136 mEq/L (ref 135–145)
Total Bilirubin: 0.6 mg/dL (ref 0.2–1.2)
Total Protein: 6.8 g/dL (ref 6.0–8.3)

## 2021-01-12 LAB — CBC WITH DIFFERENTIAL/PLATELET
Basophils Absolute: 0 10*3/uL (ref 0.0–0.1)
Basophils Relative: 0.6 % (ref 0.0–3.0)
Eosinophils Absolute: 0.1 10*3/uL (ref 0.0–0.7)
Eosinophils Relative: 1.3 % (ref 0.0–5.0)
HCT: 49 % (ref 39.0–52.0)
Hemoglobin: 16.5 g/dL (ref 13.0–17.0)
Lymphocytes Relative: 27.3 % (ref 12.0–46.0)
Lymphs Abs: 2.1 10*3/uL (ref 0.7–4.0)
MCHC: 33.6 g/dL (ref 30.0–36.0)
MCV: 89.1 fl (ref 78.0–100.0)
Monocytes Absolute: 0.7 10*3/uL (ref 0.1–1.0)
Monocytes Relative: 8.7 % (ref 3.0–12.0)
Neutro Abs: 4.8 10*3/uL (ref 1.4–7.7)
Neutrophils Relative %: 62.1 % (ref 43.0–77.0)
Platelets: 265 10*3/uL (ref 150.0–400.0)
RBC: 5.5 Mil/uL (ref 4.22–5.81)
RDW: 13.3 % (ref 11.5–15.5)
WBC: 7.7 10*3/uL (ref 4.0–10.5)

## 2021-01-12 LAB — VITAMIN B12: Vitamin B-12: 289 pg/mL (ref 211–911)

## 2021-01-12 LAB — PSA: PSA: 0.84 ng/mL (ref 0.10–4.00)

## 2021-01-12 LAB — TSH: TSH: 2.12 u[IU]/mL (ref 0.35–5.50)

## 2021-01-12 LAB — LIPID PANEL
Cholesterol: 127 mg/dL (ref 0–200)
HDL: 39.2 mg/dL (ref >39.00)
LDL Cholesterol: 72 mg/dL (ref 0–99)
NonHDL: 87.57
Total CHOL/HDL Ratio: 3
Triglycerides: 77 mg/dL (ref 0.0–149.0)
VLDL: 15.4 mg/dL (ref 0.0–40.0)

## 2021-01-12 LAB — HEMOGLOBIN A1C: Hgb A1c MFr Bld: 6.2 % (ref 4.6–6.5)

## 2021-01-12 LAB — VITAMIN D 25 HYDROXY (VIT D DEFICIENCY, FRACTURES): VITD: 34.9 ng/mL (ref 30.00–100.00)

## 2021-01-12 MED ORDER — BUPROPION HCL ER (XL) 150 MG PO TB24: 150.0000 mg | ORAL_TABLET | Freq: Every day | ORAL | 1 refills | Status: DC

## 2021-01-12 NOTE — Patient Instructions (Signed)
-  Nice seeing you today!!  -Lab work today; will notify you once results are available.  -Pneumonia vaccine today.  -Remember COVID booster and Shingles vaccines.  -Schedule follow up in 6 months.

## 2021-01-12 NOTE — Progress Notes (Signed)
Established Patient Office Visit     This visit occurred during the SARS-CoV-2 public health emergency.  Safety protocols were in place, including screening questions prior to the visit, additional usage of staff PPE, and extensive cleaning of exam room while observing appropriate contact time as indicated for disinfecting solutions.    CC/Reason for Visit: Annual preventive exam  HPI: Marc Brown is a 61 y.o. male who is coming in today for the above mentioned reasons. Past Medical History is significant for: Ongoing nicotine dependence, hyperlipidemia, vitamin D deficiency and obesity.  He is very interested in smoking cessation today.  He has routine eye and dental care, he sees a dermatologist annually.  He is overdue for conjugated pneumonia vaccine, COVID booster and shingles.  He had a colonoscopy in 2022.   Past Medical/Surgical History: Past Medical History:  Diagnosis Date   ED (erectile dysfunction)    GERD (gastroesophageal reflux disease)    rare   Hx of adenomatous colonic polyps    Hyperlipidemia    denies   Obese    Recurrent boils    Tobacco abuse     Past Surgical History:  Procedure Laterality Date   CHOLECYSTECTOMY N/A 02/14/2013   Procedure: LAPAROSCOPIC CHOLECYSTECTOMY;  Surgeon: Ernestene Mention, MD;  Location: [facility] OR;  Service: General;  Laterality: N/A;   COLONOSCOPY     ELBOW FRACTURE SURGERY Left    left   WISDOM TOOTH EXTRACTION      Social History:  reports that he has been smoking cigarettes. He has a 20.00 pack-year smoking history. He has never used smokeless tobacco. He reports current alcohol use of about 8.0 standard drinks per week. He reports that he does not use drugs.  Allergies: No Known Allergies  Family History:  Family History  Problem Relation Age of Onset   Kidney disease Mother    Hearing loss Mother        due to whooping cough   Heart disease Father    Obesity Father    Schizophrenia Father    Cancer -  Other Sister        cancer of duodeum   Multiple sclerosis Brother    Hyperlipidemia Brother    Colon cancer Neg Hx    Esophageal cancer Neg Hx    Rectal cancer Neg Hx    Stomach cancer Neg Hx    Colon polyps Neg Hx      Current Outpatient Medications:    amitriptyline (ELAVIL) 50 MG tablet, TAKE 1 TABLET BY MOUTH EVERYDAY AT BEDTIME, Disp: 90 tablet, Rfl: 0   aspirin 81 MG tablet, Take 81 mg by mouth at bedtime., Disp: , Rfl:    atorvastatin (LIPITOR) 10 MG tablet, TAKE 1 TABLET BY MOUTH EVERY DAY, Disp: 90 tablet, Rfl: 1   buPROPion (WELLBUTRIN XL) 150 MG 24 hr tablet, Take 1 tablet (150 mg total) by mouth daily., Disp: 90 tablet, Rfl: 1   Cholecalciferol (VITAMIN D3 PO), Take 1 tablet by mouth daily., Disp: , Rfl:    fluticasone (FLOVENT HFA) 44 MCG/ACT inhaler, Inhale 1 puff into the lungs 2 (two) times daily. (Patient taking differently: Inhale 1 puff into the lungs 2 (two) times daily. As needed), Disp: 2 Inhaler, Rfl: 6   ibuprofen (ADVIL) 200 MG tablet, Take 200 mg by mouth every 6 (six) hours as needed., Disp: , Rfl:    mupirocin ointment (BACTROBAN) 2 %, Apply 1 application topically daily., Disp: , Rfl:    sildenafil (  VIAGRA) 100 MG tablet, Take 0.5-1 tablets (50-100 mg total) by mouth daily as needed for erectile dysfunction., Disp: 5 tablet, Rfl: 11  Review of Systems:  Constitutional: Denies fever, chills, diaphoresis, appetite change and fatigue.  HEENT: Denies photophobia, eye pain, redness, hearing loss, ear pain, congestion, sore throat, rhinorrhea, sneezing, mouth sores, trouble swallowing, neck pain, neck stiffness and tinnitus.   Respiratory: Denies SOB, DOE, cough, chest tightness,  and wheezing.   Cardiovascular: Denies chest pain, palpitations and leg swelling.  Gastrointestinal: Denies nausea, vomiting, abdominal pain, diarrhea, constipation, blood in stool and abdominal distention.  Genitourinary: Denies dysuria, urgency, frequency, hematuria, flank pain and  difficulty urinating.  Endocrine: Denies: hot or cold intolerance, sweats, changes in hair or nails, polyuria, polydipsia. Musculoskeletal: Denies myalgias, back pain, joint swelling, arthralgias and gait problem.  Skin: Denies pallor, rash and wound.  Neurological: Denies dizziness, seizures, syncope, weakness, light-headedness, numbness and headaches.  Hematological: Denies adenopathy. Easy bruising, personal or family bleeding history  Psychiatric/Behavioral: Denies suicidal ideation, mood changes, confusion, nervousness, sleep disturbance and agitation    Physical Exam: Vitals:   01/12/21 0858  BP: 128/80  Pulse: 87  Temp: 98.1 F (36.7 C)  TempSrc: Oral  SpO2: 98%  Weight: 283 lb (128.4 kg)  Height: 6' (1.829 m)    Body mass index is 38.38 kg/m.   Constitutional: NAD, calm, comfortable, obese Eyes: PERRL, lids and conjunctivae normal, wears corrective lenses ENMT: Mucous membranes are moist. Posterior pharynx clear of any exudate or lesions. Normal dentition. Tympanic membrane is pearly white, no erythema or bulging. Neck: normal, supple, no masses, no thyromegaly Respiratory: clear to auscultation bilaterally, no wheezing, no crackles. Normal respiratory effort. No accessory muscle use.  Cardiovascular: Regular rate and rhythm, no murmurs / rubs / gallops. No extremity edema. 2+ pedal pulses. No carotid bruits.  Abdomen: no tenderness, no masses palpated. No hepatosplenomegaly. Bowel sounds positive.  Musculoskeletal: no clubbing / cyanosis. No joint deformity upper and lower extremities. Good ROM, no contractures. Normal muscle tone.  Skin: no rashes, lesions, ulcers. No induration Neurologic: CN 2-12 grossly intact. Sensation intact, DTR normal. Strength 5/5 in all 4.  Psychiatric: Normal judgment and insight. Alert and oriented x 3. Normal mood.    Impression and Plan:  Encounter for preventive health examination -Recommend routine eye and dental  care. -Immunizations: PCV 13 in office today, advised to get COVID booster and shingles vaccinations updated at pharmacy -Healthy lifestyle discussed in detail. -Labs to be updated today. -Colon cancer screening: 10/2020 -Breast cancer screening: Not applicable -Cervical cancer screening: Not applicable -Lung cancer screening: Not applicable -Prostate cancer screening: PSA today -DEXA: Not applicable  Vitamin D deficiency - VITAMIN D 25 Hydroxy (Vit-D Deficiency, Fractures), VITAMIN D 25 Hydroxy (Vit-D Deficiency, Fractures)  Mixed hyperlipidemia -Check lipid panel.  BPH associated with nocturia -Check PSA today.  Morbid obesity ([place]) -Discussed healthy lifestyle, including increased physical activity and better food choices to promote weight loss.  Tobacco abuse  - Plan: buPROPion (WELLBUTRIN XL) 150 MG 24 hr tablet, AMB Referral to [place] -I have discussed tobacco cessation with the patient.  I have counseled the patient regarding the negative impacts of continued tobacco use including but not limited to lung cancer, COPD, and cardiovascular disease.  I have discussed alternatives to tobacco and modalities that may help facilitate tobacco cessation including but not limited to biofeedback, hypnosis, and medications.  Total time spent with tobacco counseling was 5 minutes. -He is in the active state of change. -  We have agreed to trial Wellbutrin 150 mg daily, I will connect him with our chronic care management team to also assist with smoking cessation resources.  Need for vaccination against Streptococcus pneumoniae  - Plan: Pneumococcal conjugate vaccine 13-valent    Patient Instructions  -Nice seeing you today!!  -Lab work today; will notify you once results are available.  -Pneumonia vaccine today.  -Remember COVID booster and Shingles vaccines.  -Schedule follow up in 6 months.    Lelon Frohlich, MD [facility] at  [facility]

## 2021-01-13 LAB — HIV ANTIBODY (ROUTINE TESTING W REFLEX): HIV 1&2 Ab, 4th Generation: NONREACTIVE

## 2021-01-14 ENCOUNTER — Encounter: Payer: Self-pay | Admitting: Internal Medicine

## 2021-01-16 ENCOUNTER — Telehealth: Payer: Self-pay | Admitting: *Deleted

## 2021-01-16 NOTE — Telephone Encounter (Signed)
Please advise 

## 2021-01-16 NOTE — Chronic Care Management (AMB) (Signed)
°  Care Management   Note  01/16/2021 Name: Marc Brown MRN: [id number] DOB: 02-09-1960  Marc Brown is a 61 y.o. year old male who is a primary care patient of Isaac Bliss, Rayford Halsted, MD. I reached out to [name] by phone today in response to a referral sent by Marc Brown's primary care provider.   [name] was given information about care management services today including:  Care management services include personalized support from designated clinical staff supervised by his physician, including individualized plan of care and coordination with other care providers 24/7 contact phone numbers for assistance for urgent and routine care needs. The patient may stop care management services at any time by phone call to the office staff.  Patient agreed to services and verbal consent obtained.   Follow up plan: Telephone appointment with care management team member scheduled for:01/19/21  [place]  Direct Dial: [phone]

## 2021-01-18 ENCOUNTER — Telehealth: Payer: Self-pay | Admitting: Internal Medicine

## 2021-01-18 NOTE — Telephone Encounter (Signed)
Patient returned missed call from [place]. She was unavailable so I let patient know he would receive a call when someone was available.     Please advise

## 2021-01-19 ENCOUNTER — Ambulatory Visit: Payer: BC Managed Care – PPO

## 2021-01-19 DIAGNOSIS — E782 Mixed hyperlipidemia: Secondary | ICD-10-CM

## 2021-01-19 DIAGNOSIS — Z720 Tobacco use: Secondary | ICD-10-CM

## 2021-01-19 NOTE — Patient Instructions (Addendum)
Visit Information  Thank you for taking time to visit with me today. Please don't hesitate to contact me if I can be of assistance to you b  Following are the goals we discussed today:  [place] Quit [email or web address]   [phone] :[email or web address] [facility] tobacco cessation program Take all medications as prescribed Attend all scheduled provider appointments Call pharmacy for medication refills 3-7 days in advance of running out of medications Perform all self care activities independently  Call provider office for new concerns or questions  Work with the social worker to address care coordination needs and will continue to work with the clinical team to address health care and disease management related needs call for medicine refill 2 or 3 days before it runs out take all medications exactly as prescribed call doctor with any symptoms you believe are related to your medicine develop an exercise routine  Please call the care guide team at [phone] if you need to cancel or reschedule your appointment.   If you are experiencing a Mental Health or [place] or need someone to talk to, please call the Suicide and Crisis Lifeline: 988 call the [place] National Suicide Prevention Lifeline: 364-483-3763 or TTY: (714) 489-2792 TTY 4188483875) to talk to a trained counselor call 1-800-273-TALK (toll free, 24 hour hotline) go to [facility] [address] [place] [phone]) call 911   Following is a copy of your full plan of care:  Care Plan : General Nursing  (Adult)  Updates made by Dimitri Ped, RN since 01/19/2021 12:00 AM     Problem: Chronic Disease Management and Care Coordination Needs ( tobacco abuse, HLD)   Priority: High     Goal: Establish Plan of Care for   Start Date: 01/19/2021  Expected End Date: 01/19/2021   Priority: High  Note:   Current Barriers:  Knowledge Deficits related to plan of care for management of HLD and Tobacco Use   RNCM Clinical Goal(s):  Patient will verbalize understanding of plan for management of HLD and Tobacco Use as evidenced by voiced adherence to plan of care work with Education officer, museum to address  related to the management of Substance abuse issues -  tobacco related to the management of Tobacco Use as evidenced by review of EMR and patient or Education officer, museum report through collaboration with Consulting civil engineer, provider, and care team.   Interventions: 1:1 collaboration with primary care provider regarding development and update of comprehensive plan of care as evidenced by provider attestation and co-signature Inter-disciplinary care team collaboration (see longitudinal plan of care) Evaluation of current treatment plan related to  self management and patient's adherence to plan as established by provider   Smoking Cessation Interventions:  (Status:  Goal Met.) Short Term Goal Reviewed smoking history:  tobacco abuse of 30 years; currently smoking 1 ppd Previous quit attempts, unsuccessful numerous successful using none  Reports smoking within 30 minutes of waking up Reports triggers to smoke include: stress Reports motivation to quit smoking includes: health On a scale of 1-10, reports MOTIVATION to quit is 8 On a scale of 1-10, reports CONFIDENCE in quitting is 8  Evaluation of current treatment plan reviewed Advised patient to discuss smoking cessation options with provider Provided patient with printed smoking cessation educational materials Provided contact information for [facility] (1-800-QUIT-NOW) Discussed plans with patient for ongoing care management follow up and provided patient with direct contact information for care management team Referred to social worker for counseling for behavior  changes to stop smoking   Hyperlipidemia Interventions:  (Status:   Goal Met.) Short Term Goal Medication review performed; medication list updated in electronic medical record.  Provider established cholesterol goals reviewed Counseled on importance of regular laboratory monitoring as prescribed Provided HLD educational materials Reviewed role and benefits of statin for ASCVD risk reduction Reviewed importance of limiting foods high in cholesterol Reviewed exercise goals and target of 150 minutes per week  Patient Goals/Self-Care Activities: Take all medications as prescribed Attend all scheduled provider appointments Call pharmacy for medication refills 3-7 days in advance of running out of medications Perform all self care activities independently  Call provider office for new concerns or questions  Work with the social worker to address care coordination needs and will continue to work with the clinical team to address health care and disease management related needs call for medicine refill 2 or 3 days before it runs out take all medications exactly as prescribed call doctor with any symptoms you believe are related to your medicine develop an exercise routine  Follow Up Plan:  No further follow up required:         Marc Brown was given information about Care Management services by the embedded care coordination team including:  Care Management services include personalized support from designated clinical staff supervised by his physician, including individualized plan of care and coordination with other care providers 24/7 contact phone numbers for assistance for urgent and routine care needs. The patient may stop CCM services at any time (effective at the end of the month) by phone call to the office staff.  Patient agreed to services and verbal consent obtained.   Patient verbalizes understanding of instructions and care plan provided today and agrees to view in [place]. Active MyChart status confirmed with patient.    No further follow up  required: Social worker will contact you  Peter Garter RN, BSN,CCM, CDE Care Management Coordinator [facility] [phone], Mobile [phone]    Steps to Quit Smoking Smoking tobacco is the leading cause of preventable death. It can affect almost every organ in the body. Smoking puts you and people around you at risk for many serious, long-lasting (chronic) diseases. Quitting smoking can be hard, but it is one of the best things that you can do for your health. It is never too late to quit. How do I get ready to quit? When you decide to quit smoking, make a plan to help you succeed. Before you quit: Pick a date to quit. Set a date within the next 2 weeks to give you time to prepare. Write down the reasons why you are quitting. Keep this list in places where you will see it often. Tell your family, friends, and co-workers that you are quitting. Their support is important. Talk with your doctor about the choices that may help you quit. Find out if your health insurance will pay for these treatments. Know the people, places, things, and activities that make you want to smoke (triggers). Avoid them. What first steps can I take to quit smoking? Throw away all cigarettes at home, at work, and in your car. Throw away the things that you use when you smoke, such as ashtrays and lighters. Clean your car. Make sure to empty the ashtray. Clean your home, including curtains and carpets. What can I do to help me quit smoking? Talk with your doctor about taking medicines and seeing a counselor at the same time. You are more likely to  succeed when you do both. If you are pregnant or breastfeeding, talk with your doctor about counseling or other ways to quit smoking. Do not take medicine to help you quit smoking unless your doctor tells you to do so. To quit smoking: Quit right away Quit smoking totally, instead of slowly cutting back on how much you smoke over a period of  time. Go to counseling. You are more likely to quit if you go to counseling sessions regularly. Take medicine You may take medicines to help you quit. Some medicines need a prescription, and some you can buy over-the-counter. Some medicines may contain a drug called nicotine to replace the nicotine in cigarettes. Medicines may: Help you to stop having the desire to smoke (cravings). Help to stop the problems that come when you stop smoking (withdrawal symptoms). Your doctor may ask you to use: Nicotine patches, gum, or lozenges. Nicotine inhalers or sprays. Non-nicotine medicine that is taken by mouth. Find resources Find resources and other ways to help you quit smoking and remain smoke-free after you quit. These resources are most helpful when you use them often. They include: Online chats with a Social worker. Phone quitlines. Printed Furniture conservator/restorer. Support groups or group counseling. Text messaging programs. Mobile phone apps. Use apps on your mobile phone or tablet that can help you stick to your quit plan. There are many free apps for mobile phones and tablets as well as websites. Examples include Quit Guide from the [employer] and smokefree.gov  What things can I do to make it easier to quit?  Talk to your family and friends. Ask them to support and encourage you. Call a phone quitline (1-800-QUIT-NOW), reach out to support groups, or work with a Social worker. Ask people who smoke to not smoke around you. Avoid places that make you want to smoke, such as: Bars. Parties. Smoke-break areas at work. Spend time with people who do not smoke. Lower the stress in your life. Stress can make you want to smoke. Try these things to help your stress: Getting regular exercise. Doing deep-breathing exercises. Doing yoga. Meditating. Doing a body scan. To do this, close your eyes, focus on one area of your body at a time from head to toe. Notice which parts of your body are tense. Try to relax the  muscles in those areas. How will I feel when I quit smoking? Day 1 to 3 weeks Within the first 24 hours, you may start to have some problems that come from quitting tobacco. These problems are very bad 2-3 days after you quit, but they do not often last for more than 2-3 weeks. You may get these symptoms: Mood swings. Feeling restless, nervous, angry, or annoyed. Trouble concentrating. Dizziness. Strong desire for high-sugar foods and nicotine. Weight gain. Trouble pooping (constipation). Feeling like you may vomit (nausea). Coughing or a sore throat. Changes in how the medicines that you take for other issues work in your body. Depression. Trouble sleeping (insomnia). Week 3 and afterward After the first 2-3 weeks of quitting, you may start to notice more positive results, such as: Better sense of smell and taste. Less coughing and sore throat. Slower heart rate. Lower blood pressure. Clearer skin. Better breathing. Fewer sick days. Quitting smoking can be hard. Do not give up if you fail the first time. Some people need to try a few times before they succeed. Do your best to stick to your quit plan, and talk with your doctor if you have any questions or concerns.  Summary Smoking tobacco is the leading cause of preventable death. Quitting smoking can be hard, but it is one of the best things that you can do for your health. When you decide to quit smoking, make a plan to help you succeed. Quit smoking right away, not slowly over a period of time. When you start quitting, seek help from your doctor, family, or friends. This information is not intended to replace advice given to you by your health care provider. Make sure you discuss any questions you have with your health care provider. Document Revised: 08/26/2020 Document Reviewed: 03/08/2018 Elsevier Patient Education  2022 [employer]. Dyslipidemia Dyslipidemia is an imbalance of waxy, fat-like substances (lipids) in the  blood. The body needs lipids in small amounts. Dyslipidemia often involves a high level of cholesterol or triglycerides, which are types of lipids. Common forms of dyslipidemia include: High levels of LDL cholesterol. LDL is the type of cholesterol that causes fatty deposits (plaques) to build up in the blood vessels that carry blood away from the heart (arteries). Low levels of HDL cholesterol. HDL cholesterol is the type of cholesterol that protects against heart disease. High levels of HDL remove the LDL buildup from arteries. High levels of triglycerides. Triglycerides are a fatty substance in the blood that is linked to a buildup of plaques in the arteries. What are the causes? There are two main types of dyslipidemia: primary and secondary. Primary dyslipidemia is caused by changes (mutations) in genes that are passed down through families (inherited). These mutations cause several types of dyslipidemia. Secondary dyslipidemia may be caused by various risk factors that can lead to the disease, such as lifestyle choices and certain medical conditions. What increases the risk? You are more likely to develop this condition if you are an older man or if you are a woman who has gone through menopause. Other risk factors include: Having a family history of dyslipidemia. Taking certain medicines, including birth control pills, steroids, some diuretics, and beta-blockers. Eating a diet high in saturated fat. Smoking cigarettes or excessive alcohol intake. Having certain medical conditions such as diabetes, polycystic ovary syndrome (PCOS), kidney disease, liver disease, or hypothyroidism. Not exercising regularly. Being overweight or obese with too much belly fat. What are the signs or symptoms? In most cases, dyslipidemia does not usually cause any symptoms. In severe cases, very high lipid levels can cause: Fatty bumps under the skin (xanthomas). A white or gray ring around the black center  (pupil) of the eye. Very high triglyceride levels can cause inflammation of the pancreas (pancreatitis). How is this diagnosed? Your health care provider may diagnose dyslipidemia based on a routine blood test (fasting blood test). Because most people do not have symptoms of the condition, this blood testing (lipid profile) is done on adults age 71 and older and is repeated every 4-6 years. This test checks: Total cholesterol. This measures the total amount of cholesterol in your blood, including LDL cholesterol, HDL cholesterol, and triglycerides. A healthy number is below 200 mg/dL (5.17 mmol/L). LDL cholesterol. The target number for LDL cholesterol is different for each person, depending on individual risk factors. A healthy number is usually below 100 mg/dL (2.59 mmol/L). Ask your health care provider what your LDL cholesterol should be. HDL cholesterol. An HDL level of 60 mg/dL (1.55 mmol/L) or higher is best because it helps to protect against heart disease. A number below 40 mg/dL (1.03 mmol/L) for men or below 50 mg/dL (1.29 mmol/L) for women increases the risk  for heart disease. Triglycerides. A healthy triglyceride number is below 150 mg/dL (1.69 mmol/L). If your lipid profile is abnormal, your health care provider may do other blood tests. How is this treated? Treatment depends on the type of dyslipidemia that you have and your other risk factors for heart disease and stroke. Your health care provider will have a target range for your lipid levels based on this information. Treatment for dyslipidemia starts with lifestyle changes, such as diet and exercise. Your health care provider may recommend that you: Get regular exercise. Make changes to your diet. Quit smoking if you smoke. Limit your alcohol intake. If diet changes and exercise do not help you reach your goals, your health care provider may also prescribe medicine to lower lipids. The most commonly prescribed type of medicine  lowers your LDL cholesterol (statin drug). If you have a high triglyceride level, your provider may prescribe another type of drug (fibrate) or an omega-3 fish oil supplement, or both. Follow these instructions at home: Eating and drinking  Follow instructions from your health care provider or dietitian about eating or drinking restrictions. Eat a healthy diet as told by your health care provider. This can help you reach and maintain a healthy weight, lower your LDL cholesterol, and raise your HDL cholesterol. This may include: Limiting your calories, if you are overweight. Eating more fruits, vegetables, whole grains, fish, and lean meats. Limiting saturated fat, trans fat, and cholesterol. Do not drink alcohol if: Your health care provider tells you not to drink. You are pregnant, may be pregnant, or are planning to become pregnant. If you drink alcohol: Limit how much you have to: 0-1 drink a day for women. 0-2 drinks a day for men. Know how much alcohol is in your drink. In the [place] one drink equals one 12 oz bottle of beer (355 mL), one 5 oz glass of wine (148 mL), or one 1 oz glass of hard liquor (44 mL). Activity Get regular exercise. Start an exercise and strength training program as told by your health care provider. Ask your health care provider what activities are safe for you. Your health care provider may recommend: 30 minutes of aerobic activity 4-6 days a week. Brisk walking is an example of aerobic activity. Strength training 2 days a week. General instructions Do not use any products that contain nicotine or tobacco. These products include cigarettes, chewing tobacco, and vaping devices, such as e-cigarettes. If you need help quitting, ask your health care provider. Take over-the-counter and prescription medicines only as told by your health care provider. This includes supplements. Keep all follow-up visits. This is important. Contact a health care provider if: You are  having trouble sticking to your exercise or diet plan. You are struggling to quit smoking or to control your use of alcohol. Summary Dyslipidemia often involves a high level of cholesterol or triglycerides, which are types of lipids. Treatment depends on the type of dyslipidemia that you have and your other risk factors for heart disease and stroke. Treatment for dyslipidemia starts with lifestyle changes, such as diet and exercise. Your health care provider may prescribe medicine to lower lipids. This information is not intended to replace advice given to you by your health care provider. Make sure you discuss any questions you have with your health care provider. Document Revised: 02/22/2020 Document Reviewed: 02/22/2020 Elsevier Patient Education  2022 [employer].

## 2021-01-19 NOTE — Chronic Care Management (AMB) (Signed)
Care Management    RN Visit Note  01/19/2021 Name: Marc Brown MRN: [id number] DOB: 29-Sep-1960  Subjective: Marc Brown is a 61 y.o. year old male who is a primary care patient of Marc Brown, Marc Halsted, MD. The care management team was consulted for assistance with disease management and care coordination needs.    Engaged with patient by telephone for initial visit in response to provider referral for case management and/or care coordination services.   Consent to Services:   Marc Brown was given information about Care Management services today including:  Care Management services includes personalized support from designated clinical staff supervised by his physician, including individualized plan of care and coordination with other care providers 24/7 contact phone numbers for assistance for urgent and routine care needs. The patient may stop case management services at any time by phone call to the office staff.  Patient agreed to services and consent obtained.   Assessment: Review of patient past medical history, allergies, medications, health status, including review of consultants reports, laboratory and other test data, was performed as part of comprehensive evaluation and provision of chronic care management services.   SDOH (Social Determinants of Health) assessments and interventions performed:  SDOH Interventions    Flowsheet Row Most Recent Value  SDOH Interventions   Food Insecurity Interventions Intervention Not Indicated  Financial Strain Interventions Intervention Not Indicated  Housing Interventions Intervention Not Indicated  Stress Interventions Intervention Not Indicated  Transportation Interventions Intervention Not Indicated        Care Plan  No Known Allergies  Outpatient Encounter Medications as of 01/19/2021  Medication Sig Note   amitriptyline (ELAVIL) 50 MG tablet TAKE 1 TABLET BY MOUTH EVERYDAY AT BEDTIME    aspirin 81 MG tablet Take  81 mg by mouth at bedtime.    atorvastatin (LIPITOR) 10 MG tablet TAKE 1 TABLET BY MOUTH EVERY DAY    buPROPion (WELLBUTRIN XL) 150 MG 24 hr tablet Take 1 tablet (150 mg total) by mouth daily.    Cholecalciferol (VITAMIN D3 PO) Take 1 tablet by mouth daily.    fluticasone (FLOVENT HFA) 44 MCG/ACT inhaler Inhale 1 puff into the lungs 2 (two) times daily. (Patient taking differently: Inhale 1 puff into the lungs 2 (two) times daily. As needed)    ibuprofen (ADVIL) 200 MG tablet Take 200 mg by mouth every 6 (six) hours as needed.    mupirocin ointment (BACTROBAN) 2 % Apply 1 application topically daily. 07/15/2014: Received from: External Pharmacy Received Sig:    sildenafil (VIAGRA) 100 MG tablet Take 0.5-1 tablets (50-100 mg total) by mouth daily as needed for erectile dysfunction.    No facility-administered encounter medications on file as of 01/19/2021.    Patient Active Problem List   Diagnosis Date Noted   IGT (impaired glucose tolerance) 01/12/2021   Vitamin D deficiency 12/18/2019   Hyperlipidemia 12/18/2019   Routine general medical examination at a health care facility 05/13/2017   BPH associated with nocturia 05/13/2017   History of colon polyps 05/13/2017   Elevated blood sugar 18/84/1660   Metabolic syndrome 63/01/6008   FOOT DROP, RIGHT 07/25/2009   Obesity 09/25/2006   ERECTILE DYSFUNCTION 09/25/2006   Tobacco abuse 09/25/2006    Conditions to be addressed/monitored: HLD and Tobacco Use  Care Plan : RN Care Manager Plan of Care  Updates made by Dimitri Ped, RN since 01/19/2021 12:00 AM  Completed 01/19/2021   Problem: Chronic Disease Management and Care Coordination Needs ( tobacco  abuse, HLD) Resolved 01/19/2021  Priority: High     Goal: Establish Plan of Care for Completed 01/19/2021  Start Date: 01/19/2021  Expected End Date: 01/19/2021  Priority: High  Note:   Current Barriers:  Knowledge Deficits related to plan of care for management of HLD and Tobacco  Use   RNCM Clinical Goal(s):  Patient will verbalize understanding of plan for management of HLD and Tobacco Use as evidenced by voiced adherence to plan of care work with Education officer, museum to address  related to the management of Substance abuse issues -  tobacco related to the management of Tobacco Use as evidenced by review of EMR and patient or Education officer, museum report through collaboration with Consulting civil engineer, provider, and care team.   Interventions: 1:1 collaboration with primary care provider regarding development and update of comprehensive plan of care as evidenced by provider attestation and co-signature Inter-disciplinary care team collaboration (see longitudinal plan of care) Evaluation of current treatment plan related to  self management and patient's adherence to plan as established by provider   Smoking Cessation Interventions:  (Status:  Goal Met.) Short Term Goal Reviewed smoking history:  tobacco abuse of 30 years; currently smoking 1 ppd Previous quit attempts, unsuccessful numerous successful using none  Reports smoking within 30 minutes of waking up Reports triggers to smoke include: stress Reports motivation to quit smoking includes: health On a scale of 1-10, reports MOTIVATION to quit is 8 On a scale of 1-10, reports CONFIDENCE in quitting is 8  Evaluation of current treatment plan reviewed Advised patient to discuss smoking cessation options with provider Provided patient with printed smoking cessation educational materials Provided contact information for [place] Quit Line (1-800-QUIT-NOW) Discussed plans with patient for ongoing care management follow up and provided patient with direct contact information for care management team Referred to social worker for counseling for behavior changes to stop smoking   Hyperlipidemia Interventions:  (Status:  Goal Met.) Short Term Goal Medication review performed; medication list updated in electronic medical record.  Provider  established cholesterol goals reviewed Counseled on importance of regular laboratory monitoring as prescribed Provided HLD educational materials Reviewed role and benefits of statin for ASCVD risk reduction Reviewed importance of limiting foods high in cholesterol Reviewed exercise goals and target of 150 minutes per week  Patient Goals/Self-Care Activities: Take all medications as prescribed Attend all scheduled provider appointments Call pharmacy for medication refills 3-7 days in advance of running out of medications Perform all self care activities independently  Call provider office for new concerns or questions  Work with the social worker to address care coordination needs and will continue to work with the clinical team to address health care and disease management related needs call for medicine refill 2 or 3 days before it runs out take all medications exactly as prescribed call doctor with any symptoms you believe are related to your medicine develop an exercise routine  Follow Up Plan:  No further follow up required:         Plan: No further follow up required:   Peter Garter RN, Jackquline Denmark, CDE Care Management Coordinator [place] [phone], [place] [phone]

## 2021-01-20 ENCOUNTER — Telehealth: Payer: Self-pay | Admitting: *Deleted

## 2021-01-20 NOTE — Telephone Encounter (Signed)
See result note from labs drawn on 01/12/21

## 2021-01-20 NOTE — Chronic Care Management (AMB) (Signed)
°  Care Management   Note  01/20/2021 Name: Marc Brown MRN: [id number] DOB: 11-Mar-1960  Marc Brown is a 61 y.o. year old male who is a primary care patient of Philip Aspen, Limmie Patricia, MD and is actively engaged with the care management team. I reached out to Ernst Spell by phone today to assist with scheduling an initial visit with the Licensed Clinical Social Worker  Follow up plan: Unsuccessful telephone outreach attempt made. A HIPAA compliant phone message was left for the patient providing contact information and requesting a return call.  The care management team will reach out to the patient again over the next 7 days.  If patient returns call to provider office, please advise to call Embedded Care Management Care Guide Marc Brown  at [phone].  Marc Brown  Care Guide, Embedded Care Coordination [facility] Management  Direct Dial: [phone]

## 2021-01-23 NOTE — Chronic Care Management (AMB) (Signed)
°  Care Management   Note  01/23/2021 Name: Marc Brown MRN: [id number] DOB: [birth year]  Marc Brown is a 61 y.o. year old male who is a primary care patient of Isaac Bliss, Rayford Halsted, MD and is actively engaged with the care management team. I reached out to [name] by phone today to assist with scheduling an initial visit with the Licensed Clinical Social Worker  Follow up plan: Telephone appointment with care management team member scheduled for:02/02/21  [facility] Management  Direct Dial: [phone]

## 2021-02-02 ENCOUNTER — Ambulatory Visit: Payer: BC Managed Care – PPO | Admitting: Licensed Clinical Social Worker

## 2021-02-07 NOTE — Chronic Care Management (AMB) (Signed)
Care Management Clinical Social Work Note  02/07/2021 Name: Marc Brown MRN: [id number] DOB: 11-03-1960  Marc Brown is a 61 y.o. year old male who is a primary care patient of Marc Brown, Marc Patricia, MD.  The Care Management team was consulted for assistance with chronic disease management and coordination needs.  Engaged with patient by telephone for initial visit in response to provider referral for social work chronic care management and care coordination services  Consent to Services:  Marc Brown was given information about Care Management services today including:  Care Management services includes personalized support from designated clinical staff supervised by his physician, including individualized plan of care and coordination with other care providers 24/7 contact phone numbers for assistance for urgent and routine care needs. The patient may stop case management services at any time by phone call to the office staff.  Patient agreed to services and consent obtained.   Summary: Assessed patient's previous and current treatment, coping skills, support system and barriers to care. Patient was successful in identifying triggers to tobacco use and healthy strategies to decrease use discussed. LCSW provided supportive information to assist with SMART goals.  See Care Plan below for interventions and patient self-care activities.  Recommendation: Patient may benefit from, and is in agreement work with LCSW to address care coordination needs and will continue to work with the clinical team to address health care and disease management related needs.   Follow up Plan: Patient would like continued follow-up from CCM LCSW.  per patient's request will follow up in 3-4 weeks.  Will call office if needed prior to next encounter.   SDOH (Social Determinants of Health) assessments and interventions performed:    Advanced Directives Status: Not addressed in this encounter.  Care  Plan  No Known Allergies  Outpatient Encounter Medications as of 02/02/2021  Medication Sig Note   amitriptyline (ELAVIL) 50 MG tablet TAKE 1 TABLET BY MOUTH EVERYDAY AT BEDTIME    aspirin 81 MG tablet Take 81 mg by mouth at bedtime.    atorvastatin (LIPITOR) 10 MG tablet TAKE 1 TABLET BY MOUTH EVERY DAY    buPROPion (WELLBUTRIN XL) 150 MG 24 hr tablet Take 1 tablet (150 mg total) by mouth daily.    Cholecalciferol (VITAMIN D3 PO) Take 1 tablet by mouth daily.    fluticasone (FLOVENT HFA) 44 MCG/ACT inhaler Inhale 1 puff into the lungs 2 (two) times daily. (Patient taking differently: Inhale 1 puff into the lungs 2 (two) times daily. As needed)    ibuprofen (ADVIL) 200 MG tablet Take 200 mg by mouth every 6 (six) hours as needed.    mupirocin ointment (BACTROBAN) 2 % Apply 1 application topically daily. 07/15/2014: Received from: External Pharmacy Received Sig:    sildenafil (VIAGRA) 100 MG tablet Take 0.5-1 tablets (50-100 mg total) by mouth daily as needed for erectile dysfunction.    No facility-administered encounter medications on file as of 02/02/2021.    Patient Active Problem List   Diagnosis Date Noted   IGT (impaired glucose tolerance) 01/12/2021   Vitamin D deficiency 12/18/2019   Hyperlipidemia 12/18/2019   Routine general medical examination at a health care facility 05/13/2017   BPH associated with nocturia 05/13/2017   History of colon polyps 05/13/2017   Elevated blood sugar 09/19/2015   Metabolic syndrome 09/19/2015   FOOT DROP, RIGHT 07/25/2009   Obesity 09/25/2006   ERECTILE DYSFUNCTION 09/25/2006   Tobacco abuse 09/25/2006    Conditions to be addressed/monitored: Tobacco Use  Care Plan : LCSW Plan of Care  Updates made by Bridgett Larsson, LCSW since 02/07/2021 12:00 AM     Problem: Tobacco Use      Long-Range Goal: Decrease in Tobacco Use   Start Date: 02/02/2021  This Visit's Progress: On track  Priority: High  Note:   Current barriers:     Tobacco  Use Clinical Goals: Patient will work with CCM LCSW to address needs related to tobacco cessation Clinical Interventions:  Assessment of needs, barriers , agencies contacted, as well as how impacting  Patient would like to decrease tobacco use (1 pack per day) Patient has decreased cigarette use with medication management Patient successfully identified triggers and patterns regarding tobacco use and has implemented strategies to combat LCSW provided validation and support. Pt has great insight on how stress can negatively impact physical and mental health Healthy coping skills discussed (Mindfulness apps, Walking) Motivational Interviewing employed Solution-Focused Strategies employed: SMART Goals Worksheet provided to patient via e-mail Active listening / Reflection utilized  Industrial/product designer Provided Verbalization of feelings encouraged  Review various resources, discussed options and provided patient information about  [place] Quitline 1:1 collaboration with primary care provider regarding development and update of comprehensive plan of care as evidenced by provider attestation and co-signature Inter-disciplinary care team collaboration (see longitudinal plan of care) Patient Goals/Self-Care Activities: Over the next 120 days Attend scheduled medical appointments Utilize healthy coping skills and/or resources provided Contact PCP office with any questions or concerns        Jenel Lucks, MSW, LCSW [employer] Marc Brown   Marc Brown Phone [phone] 8:38 AM

## 2021-02-07 NOTE — Patient Instructions (Signed)
Visit Information  Thank you for taking time to visit with me today. Please don't hesitate to contact me if I can be of assistance to you before our next scheduled telephone appointment.  Following are the goals we discussed today:  Patient Goals/Self-Care Activities: Over the next 120 days Attend scheduled medical appointments Utilize healthy coping skills and/or resources provided Contact PCP office with any questions or concerns  Our next appointment is by telephone on 02/23/21 at 2:30 PM  Please call the care guide team at [phone] if you need to cancel or reschedule your appointment.   If you are experiencing a Mental Health or [place] or need someone to talk to, please call the [place] National Suicide Prevention Lifeline: 845 322 0778 or TTY: (513)355-4135 TTY (817) 120-7010) to talk to a trained counselor call 911   Following is a copy of your full plan of care:  Care Plan : LCSW Plan of Care  Updates made by Rebekah Chesterfield, LCSW since 02/07/2021 12:00 AM     Problem: Tobacco Use      Long-Range Goal: Decrease in Tobacco Use   Start Date: 02/02/2021  This Visit's Progress: On track  Priority: High  Note:   Current barriers:     Tobacco Use Clinical Goals: Patient will work with CCM LCSW to address needs related to tobacco cessation Clinical Interventions:  Assessment of needs, barriers , agencies contacted, as well as how impacting  Patient would like to decrease tobacco use (1 pack per day) Patient has decreased cigarette use with medication management Patient successfully identified triggers and patterns regarding tobacco use and has implemented strategies to combat LCSW provided validation and support. Pt has great insight on how stress can negatively impact physical and mental health Healthy coping skills discussed (Mindfulness apps, Walking) Motivational Interviewing employed Solution-Focused Strategies employed: [place] provided to  patient via e-mail Active listening / Reflection utilized  Engineer, petroleum Provided Verbalization of feelings encouraged  Review various resources, discussed options and provided patient information about   Quitline 1:1 collaboration with primary care provider regarding development and update of comprehensive plan of care as evidenced by provider attestation and co-signature Inter-disciplinary care team collaboration (see longitudinal plan of care) Patient Goals/Self-Care Activities: Over the next 120 days Attend scheduled medical appointments Utilize healthy coping skills and/or resources provided Contact PCP office with any questions or concerns       Mr. Lemarr was given information about Care Management services by the embedded care coordination team including:  Care Management services include personalized support from designated clinical staff supervised by his physician, including individualized plan of care and coordination with other care providers 24/7 contact phone numbers for assistance for urgent and routine care needs. The patient may stop CCM services at any time (effective at the end of the month) by phone call to the office staff.  Patient agreed to services and verbal consent obtained.   Patient verbalizes understanding of instructions and care plan provided today and agrees to view in [place]. Active MyChart status confirmed with patient.    Christa See, MSW, [email or web address] Phone [phone] 8:37 AM

## 2021-02-10 ENCOUNTER — Other Ambulatory Visit: Payer: Self-pay | Admitting: Internal Medicine

## 2021-02-10 DIAGNOSIS — R6884 Jaw pain: Secondary | ICD-10-CM

## 2021-02-23 ENCOUNTER — Telehealth: Payer: Self-pay | Admitting: *Deleted

## 2021-02-23 ENCOUNTER — Telehealth: Payer: BC Managed Care – PPO

## 2021-02-23 NOTE — Chronic Care Management (AMB) (Signed)
°  Care Management   Note  02/23/2021 Name: CHIBUEZE BEASLEY MRN: [id number] DOB: 05-22-1960  MARKEL KURTENBACH is a 61 y.o. year old male who is a primary care patient of Philip Aspen, Limmie Patricia, MD and is actively engaged with the care management team. I reached out to Ernst Spell by phone today to assist with re-scheduling a follow up visit with the Licensed Clinical Social Worker  Follow up plan: Unsuccessful telephone outreach attempt made. A HIPAA compliant phone message was left for the patient providing contact information and requesting a return call.  The care management team will reach out to the patient again over the next 7 days.  If patient returns call to provider office, please advise to call Embedded Care Management Care Guide Misty Stanley  at [phone].  Gwenevere Ghazi  Care Guide, Embedded Care Coordination [facility] Management  Direct Dial: [phone]

## 2021-03-02 NOTE — Chronic Care Management (AMB) (Signed)
?  Care Management  ? ?Note ? ?03/02/2021 ?Name: Marc Brown MRN: [id number] DOB: [birth year] ? ?Marc Brown is a 61 y.o. year old male who is a primary care patient of Philip Aspen, Limmie Patricia, MD and is actively engaged with the care management team. I reached out to Ernst Spell by phone today to assist with re-scheduling a follow up visit with the Licensed Clinical Social Worker ? ?Follow up plan: ?A second unsuccessful telephone outreach attempt made. A HIPAA compliant phone message was left for the patient providing contact information and requesting a return call. The care management team will reach out to the patient again over the next 7 days.  ?If patient returns call to provider office, please advise to call Embedded Care Management Care Guide Misty Stanley at [phone] ? ?Gwenevere Ghazi  ?Care Guide, Embedded Care Coordination ?[place]  Care Management  ?Direct Dial: [phone] ? ?

## 2021-03-07 ENCOUNTER — Telehealth (INDEPENDENT_AMBULATORY_CARE_PROVIDER_SITE_OTHER): Payer: BC Managed Care – PPO | Admitting: Internal Medicine

## 2021-03-07 ENCOUNTER — Encounter: Payer: Self-pay | Admitting: Internal Medicine

## 2021-03-07 VITALS — Wt 270.0 lb

## 2021-03-07 DIAGNOSIS — J069 Acute upper respiratory infection, unspecified: Secondary | ICD-10-CM

## 2021-03-07 MED ORDER — BENZONATATE 100 MG PO CAPS: 100.0000 mg | ORAL_CAPSULE | Freq: Three times a day (TID) | ORAL | 0 refills | Status: DC | PRN

## 2021-03-07 NOTE — Progress Notes (Signed)
? ? ?Virtual Visit via Video Note ? ?I connected with Marc Brown on 03/07/21 at  4:00 PM EST by a video enabled telemedicine application and verified that I am speaking with the correct person using two identifiers. ? ?Location patient: home ?Location provider: work office ?Persons participating in the virtual visit: patient, provider ? ?I discussed the limitations of evaluation and management by telemedicine and the availability of in person appointments. The patient expressed understanding and agreed to proceed. ? ? ?HPI: ?He has been having a cough now for 3 weeks.  Approximately 3 weeks ago he had a quite cold and tested negative for COVID.  He continues to have postnasal drip, he has used over-the-counter Robitussin as well as multisymptom cold products.  His main concern at this time is cough, it is productive of white phlegm, is having difficulty sleeping because of it. ? ? ?ROS: ?Constitutional: Denies fever, chills, diaphoresis, appetite change and fatigue.  ?HEENT: Denies photophobia, eye pain, redness, hearing loss, mouth sores, trouble swallowing, neck pain, neck stiffness and tinnitus.   ?Respiratory: Denies SOB, DOE,  chest tightness,  and wheezing.   ?Cardiovascular: Denies chest pain, palpitations and leg swelling.  ?Gastrointestinal: Denies nausea, vomiting, abdominal pain, diarrhea, constipation, blood in stool and abdominal distention.  ?Genitourinary: Denies dysuria, urgency, frequency, hematuria, flank pain and difficulty urinating.  ?Endocrine: Denies: hot or cold intolerance, sweats, changes in hair or nails, polyuria, polydipsia. ?Musculoskeletal: Denies myalgias, back pain, joint swelling, arthralgias and gait problem.  ?Skin: Denies pallor, rash and wound.  ?Neurological: Denies dizziness, seizures, syncope, weakness, light-headedness, numbness and headaches.  ?Hematological: Denies adenopathy. Easy bruising, personal or family bleeding history  ?Psychiatric/Behavioral: Denies  suicidal ideation, mood changes, confusion, nervousness, sleep disturbance and agitation ? ? ?Past Medical History:  ?Diagnosis Date  ? ED (erectile dysfunction)   ? GERD (gastroesophageal reflux disease)   ? rare  ? Hx of adenomatous colonic polyps   ? Hyperlipidemia   ? denies  ? Obese   ? Recurrent boils   ? Tobacco abuse   ? ? ?Past Surgical History:  ?Procedure Laterality Date  ? CHOLECYSTECTOMY N/A 02/14/2013  ? Procedure: LAPAROSCOPIC CHOLECYSTECTOMY;  Surgeon: Ernestene Mention, MD;  Location: [facility];  Service: General;  Laterality: N/A;  ? COLONOSCOPY    ? ELBOW FRACTURE SURGERY Left   ? left  ? WISDOM TOOTH EXTRACTION    ? ? ?Family History  ?Problem Relation Age of Onset  ? Kidney disease Mother   ? Hearing loss Mother   ?     due to whooping cough  ? Heart disease Father   ? Obesity Father   ? Schizophrenia Father   ? Cancer - Other Sister   ?     cancer of duodeum  ? Multiple sclerosis Brother   ? Hyperlipidemia Brother   ? Colon cancer Neg Hx   ? Esophageal cancer Neg Hx   ? Rectal cancer Neg Hx   ? Stomach cancer Neg Hx   ? Colon polyps Neg Hx   ? ? ?SOCIAL HX:  ? reports that he has been smoking cigarettes. He has a 20.00 pack-year smoking history. He has never used smokeless tobacco. He reports current alcohol use of about 8.0 standard drinks per week. He reports that he does not use drugs. ? ? ?Current Outpatient Medications:  ?  amitriptyline (ELAVIL) 50 MG tablet, TAKE 1 TABLET BY MOUTH EVERYDAY AT BEDTIME, Disp: 90 tablet, Rfl: 0 ?  aspirin 81 MG tablet,  Take 81 mg by mouth at bedtime., Disp: , Rfl:  ?  atorvastatin (LIPITOR) 10 MG tablet, TAKE 1 TABLET BY MOUTH EVERY DAY, Disp: 90 tablet, Rfl: 1 ?  benzonatate (TESSALON) 100 MG capsule, Take 1 capsule (100 mg total) by mouth 3 (three) times daily as needed for cough., Disp: 40 capsule, Rfl: 0 ?  buPROPion (WELLBUTRIN XL) 150 MG 24 hr tablet, Take 1 tablet (150 mg total) by mouth daily., Disp: 90 tablet, Rfl: 1 ?  Cholecalciferol (VITAMIN D3 PO),  Take 1 tablet by mouth daily., Disp: , Rfl:  ?  fluticasone (FLOVENT HFA) 44 MCG/ACT inhaler, Inhale 1 puff into the lungs 2 (two) times daily. (Patient taking differently: Inhale 1 puff into the lungs 2 (two) times daily. As needed), Disp: 2 Inhaler, Rfl: 6 ?  ibuprofen (ADVIL) 200 MG tablet, Take 200 mg by mouth every 6 (six) hours as needed., Disp: , Rfl:  ?  mupirocin ointment (BACTROBAN) 2 %, Apply 1 application topically daily., Disp: , Rfl:  ?  sildenafil (VIAGRA) 100 MG tablet, Take 0.5-1 tablets (50-100 mg total) by mouth daily as needed for erectile dysfunction., Disp: 5 tablet, Rfl: 11 ? ?EXAM:  ? ?VITALS per patient if applicable: None reported ? ?GENERAL: alert, oriented, appears well and in no acute distress ? ?HEENT: atraumatic, conjunttiva clear, no obvious abnormalities on inspection of external nose and ears, wears corrective lenses ? ?NECK: normal movements of the head and neck ? ?LUNGS: on inspection no signs of respiratory distress, breathing rate appears normal, no obvious gross increased work of breathing, gasping or wheezing ? ?CV: no obvious cyanosis ? ?MS: moves all visible extremities without noticeable abnormality ? ?PSYCH/NEURO: pleasant and cooperative, no obvious depression or anxiety, speech and thought processing grossly intact ? ?ASSESSMENT AND PLAN: ? ? ?Viral URI with cough ? - Plan: benzonatate (TESSALON) 100 MG capsule ?-Given presentation, PNA, pharyngitis, ear infection are not likely, hence abx have not been prescribed. ?-Have advised rest, fluids, OTC antihistamines, cough suppressants and mucinex. ?-RTC if no improvement in 10-14 days. ?-Suspect she is dealing with a post URI cough, will prescribe Tessalon Perles, he will follow-up with me in 10 weeks if no significant improvement. ? ? ?  ?I discussed the assessment and treatment plan with the patient. The patient was provided an opportunity to ask questions and all were answered. The patient agreed with the plan and  demonstrated an understanding of the instructions. ?  ?The patient was advised to call back or seek an in-person evaluation if the symptoms worsen or if the condition fails to improve as anticipated. ? ? ? ?[name], MD  ?[facility] ? ?

## 2021-03-10 NOTE — Chronic Care Management (AMB) (Signed)
?  Care Management  ? ?Note ? ?03/10/2021 ?Name: Marc Brown MRN: [id number] DOB: 06/17/1960 ? ?[name] is a 61 y.o. year old male who is a primary care patient of Philip Aspen, Limmie Patricia, MD and is actively engaged with the care management team. I reached out to Ernst Spell by phone today to assist with re-scheduling a follow up visit with the Licensed Clinical Social Worker ? ?Follow up plan: ?Unsuccessful telephone outreach attempt made. A HIPAA compliant phone message was left for the patient providing contact information and requesting a return call. Unable to make contact on outreach attempts x 3. PCP Philip Aspen, Limmie Patricia, MD notified via routed documentation in medical record. We have been unable to make contact with the patient for follow up. The care management team is available to follow up with the patient after provider conversation with the patient regarding recommendation for care management engagement and subsequent re-referral to the care management team.  ? ?Gwenevere Ghazi  ?Care Guide, Embedded Care Coordination ?[facility]  ?Direct Dial: [phone] ? ?

## 2021-05-14 ENCOUNTER — Other Ambulatory Visit: Payer: Self-pay | Admitting: Internal Medicine

## 2021-05-14 DIAGNOSIS — R6884 Jaw pain: Secondary | ICD-10-CM

## 2021-05-18 ENCOUNTER — Encounter (INDEPENDENT_AMBULATORY_CARE_PROVIDER_SITE_OTHER): Payer: BC Managed Care – PPO | Admitting: Internal Medicine

## 2021-05-18 DIAGNOSIS — S30861A Insect bite (nonvenomous) of abdominal wall, initial encounter: Secondary | ICD-10-CM | POA: Diagnosis not present

## 2021-05-18 DIAGNOSIS — W57XXXA Bitten or stung by nonvenomous insect and other nonvenomous arthropods, initial encounter: Secondary | ICD-10-CM

## 2021-05-18 MED ORDER — DOXYCYCLINE HYCLATE 100 MG PO TABS: 100.0000 mg | ORAL_TABLET | Freq: Two times a day (BID) | ORAL | 0 refills | Status: AC

## 2021-05-18 NOTE — Telephone Encounter (Signed)
Please see the MyChart message reply(ies) for my assessment and plan.  The patient gave consent for this Medical Advice Message and is aware that it may result in a bill to their insurance company as well as the possibility that this may result in a co-payment or deductible. They are an established patient, but are not seeking medical advice exclusively about a problem treated during an in person or video visit in the last 7 days. I did not recommend an in person or video visit within 7 days of my reply.  I spent a total of 5 minutes cumulative time within 7 days through [employer] Augustino Savastano Philip Aspen, MD

## 2021-06-28 ENCOUNTER — Other Ambulatory Visit: Payer: Self-pay | Admitting: Internal Medicine

## 2021-06-28 DIAGNOSIS — E782 Mixed hyperlipidemia: Secondary | ICD-10-CM

## 2021-06-28 DIAGNOSIS — Z720 Tobacco use: Secondary | ICD-10-CM

## 2021-08-11 ENCOUNTER — Ambulatory Visit: Payer: BC Managed Care – PPO | Admitting: Licensed Clinical Social Worker

## 2021-08-11 ENCOUNTER — Other Ambulatory Visit: Payer: Self-pay | Admitting: Internal Medicine

## 2021-08-11 DIAGNOSIS — R6884 Jaw pain: Secondary | ICD-10-CM

## 2021-08-11 NOTE — Chronic Care Management (AMB) (Signed)
    Clinical Social Work  Care Management   Phone Outreach    08/11/2021 Name: Marc Brown MRN: [id number] DOB: June 11, 1960  Marc Brown is a 61 y.o. year old male who is a primary care patient of Philip Aspen, Limmie Patricia, MD .   Reason for referral:  Tobacco Cessation .    F/U phone call today to assess needs, progress and barriers with care plan goals.  Patient was unable to conduct a telephone appointment and requested to call LCSW another time  Plan:CCM LCSW will wait for return call. If no return call is received, patient can contact PCP for a new referral for care coordination services. Per chart review, patient is receiving tobacco cessation services; therefore, previous care plan will be closed  Review of patient status, including review of consultants reports, relevant laboratory and other test results, and collaboration with appropriate care team members and the patient's provider was performed as part of comprehensive patient evaluation and provision of care management services.     Jenel Lucks, MSW, LCSW [facility]  [facility] [email or web address] Phone [phone] 12:33 PM

## 2021-08-21 ENCOUNTER — Encounter: Payer: Self-pay | Admitting: Internal Medicine

## 2021-08-22 ENCOUNTER — Other Ambulatory Visit: Payer: Self-pay | Admitting: Internal Medicine

## 2021-08-22 DIAGNOSIS — T753XXA Motion sickness, initial encounter: Secondary | ICD-10-CM

## 2021-08-22 MED ORDER — SCOPOLAMINE 1 MG/3DAYS TD PT72: 1.0000 | MEDICATED_PATCH | TRANSDERMAL | 12 refills | Status: AC

## 2021-09-18 ENCOUNTER — Other Ambulatory Visit: Payer: Self-pay | Admitting: Internal Medicine

## 2021-09-18 MED ORDER — NIRMATRELVIR/RITONAVIR (PAXLOVID)TABLET: 3.0000 | ORAL_TABLET | Freq: Two times a day (BID) | ORAL | 0 refills | Status: AC

## 2021-09-18 NOTE — Progress Notes (Signed)
Patient returning from cruise started to have symptoms of subjective chills/fever/sore throat and cough. Took antigen test which was positive. Risk factor of smoking/lung disease. Would benefit from taking paxlovid.

## 2021-10-18 ENCOUNTER — Ambulatory Visit (INDEPENDENT_AMBULATORY_CARE_PROVIDER_SITE_OTHER): Payer: BC Managed Care – PPO | Admitting: *Deleted

## 2021-10-18 DIAGNOSIS — Z23 Encounter for immunization: Secondary | ICD-10-CM

## 2021-11-10 ENCOUNTER — Other Ambulatory Visit: Payer: Self-pay | Admitting: Internal Medicine

## 2021-11-10 DIAGNOSIS — R6884 Jaw pain: Secondary | ICD-10-CM

## 2021-12-12 ENCOUNTER — Telehealth: Payer: Self-pay | Admitting: Internal Medicine

## 2021-12-12 DIAGNOSIS — Z720 Tobacco use: Secondary | ICD-10-CM

## 2021-12-12 DIAGNOSIS — R6884 Jaw pain: Secondary | ICD-10-CM

## 2021-12-12 DIAGNOSIS — E782 Mixed hyperlipidemia: Secondary | ICD-10-CM

## 2021-12-12 MED ORDER — BUPROPION HCL ER (XL) 150 MG PO TB24: 150.0000 mg | ORAL_TABLET | Freq: Every day | ORAL | 0 refills | Status: DC

## 2021-12-12 MED ORDER — ATORVASTATIN CALCIUM 10 MG PO TABS: 10.0000 mg | ORAL_TABLET | Freq: Every day | ORAL | 0 refills | Status: DC

## 2021-12-12 MED ORDER — AMITRIPTYLINE HCL 50 MG PO TABS: 50.0000 mg | ORAL_TABLET | Freq: Every day | ORAL | 0 refills | Status: DC

## 2021-12-12 NOTE — Telephone Encounter (Signed)
Pt called to request refills for the following:  buPROPion (WELLBUTRIN XL) 150 MG 24 hr tablet   amitriptyline (ELAVIL) 50 MG tablet   atorvastatin (LIPITOR) 10 MG tablet   LOV:  01/12/21  Pt has been scheduled for a CPE on 02/07/22  [id number] - [place], [place] - [address] Phone: [phone]  Fax: [phone]      Also, Pt would like to know if MD can add an order for him to get the Pneumo vax.  Please advise.

## 2022-02-07 ENCOUNTER — Ambulatory Visit (INDEPENDENT_AMBULATORY_CARE_PROVIDER_SITE_OTHER): Payer: BC Managed Care – PPO | Admitting: Internal Medicine

## 2022-02-07 ENCOUNTER — Encounter: Payer: Self-pay | Admitting: Internal Medicine

## 2022-02-07 VITALS — BP 110/80 | HR 73 | Temp 97.9°F | Ht 72.5 in | Wt 281.6 lb

## 2022-02-07 DIAGNOSIS — E782 Mixed hyperlipidemia: Secondary | ICD-10-CM | POA: Diagnosis not present

## 2022-02-07 DIAGNOSIS — Z0000 Encounter for general adult medical examination without abnormal findings: Secondary | ICD-10-CM | POA: Diagnosis not present

## 2022-02-07 DIAGNOSIS — R7302 Impaired glucose tolerance (oral): Secondary | ICD-10-CM | POA: Diagnosis not present

## 2022-02-07 DIAGNOSIS — N401 Enlarged prostate with lower urinary tract symptoms: Secondary | ICD-10-CM

## 2022-02-07 DIAGNOSIS — E559 Vitamin D deficiency, unspecified: Secondary | ICD-10-CM

## 2022-02-07 DIAGNOSIS — E8881 Metabolic syndrome: Secondary | ICD-10-CM | POA: Diagnosis not present

## 2022-02-07 DIAGNOSIS — Z720 Tobacco use: Secondary | ICD-10-CM

## 2022-02-07 DIAGNOSIS — Z23 Encounter for immunization: Secondary | ICD-10-CM | POA: Diagnosis not present

## 2022-02-07 DIAGNOSIS — R351 Nocturia: Secondary | ICD-10-CM

## 2022-02-07 LAB — COMPREHENSIVE METABOLIC PANEL
ALT: 34 U/L (ref 0–53)
AST: 22 U/L (ref 0–37)
Albumin: 4.7 g/dL (ref 3.5–5.2)
Alkaline Phosphatase: 64 U/L (ref 39–117)
BUN: 16 mg/dL (ref 6–23)
CO2: 22 mEq/L (ref 19–32)
Calcium: 9 mg/dL (ref 8.4–10.5)
Chloride: 104 mEq/L (ref 96–112)
Creatinine, Ser: 0.97 mg/dL (ref 0.40–1.50)
GFR: 84.18 mL/min (ref >60.00)
Glucose, Bld: 99 mg/dL (ref 70–99)
Potassium: 4.5 mEq/L (ref 3.5–5.1)
Sodium: 136 mEq/L (ref 135–145)
Total Bilirubin: 0.6 mg/dL (ref 0.2–1.2)
Total Protein: 7.3 g/dL (ref 6.0–8.3)

## 2022-02-07 LAB — LIPID PANEL
Cholesterol: 118 mg/dL (ref 0–200)
HDL: 35.1 mg/dL — ABNORMAL LOW (ref >39.00)
LDL Cholesterol: 65 mg/dL (ref 0–99)
NonHDL: 82.42
Total CHOL/HDL Ratio: 3
Triglycerides: 86 mg/dL (ref 0.0–149.0)
VLDL: 17.2 mg/dL (ref 0.0–40.0)

## 2022-02-07 LAB — CBC WITH DIFFERENTIAL/PLATELET
Basophils Absolute: 0 10*3/uL (ref 0.0–0.1)
Basophils Relative: 0.7 % (ref 0.0–3.0)
Eosinophils Absolute: 0.1 10*3/uL (ref 0.0–0.7)
Eosinophils Relative: 1.7 % (ref 0.0–5.0)
HCT: 50.5 % (ref 39.0–52.0)
Hemoglobin: 17.8 g/dL — ABNORMAL HIGH (ref 13.0–17.0)
Lymphocytes Relative: 36.8 % (ref 12.0–46.0)
Lymphs Abs: 2.5 10*3/uL (ref 0.7–4.0)
MCHC: 35.2 g/dL (ref 30.0–36.0)
MCV: 88.6 fl (ref 78.0–100.0)
Monocytes Absolute: 0.6 10*3/uL (ref 0.1–1.0)
Monocytes Relative: 8.4 % (ref 3.0–12.0)
Neutro Abs: 3.5 10*3/uL (ref 1.4–7.7)
Neutrophils Relative %: 52.4 % (ref 43.0–77.0)
Platelets: 249 10*3/uL (ref 150.0–400.0)
RBC: 5.71 Mil/uL (ref 4.22–5.81)
RDW: 13 % (ref 11.5–15.5)
WBC: 6.7 10*3/uL (ref 4.0–10.5)

## 2022-02-07 LAB — VITAMIN B12: Vitamin B-12: 309 pg/mL (ref 211–911)

## 2022-02-07 LAB — TSH: TSH: 2.16 u[IU]/mL (ref 0.35–5.50)

## 2022-02-07 LAB — HEMOGLOBIN A1C: Hgb A1c MFr Bld: 6.1 % (ref 4.6–6.5)

## 2022-02-07 LAB — PSA: PSA: 1.28 ng/mL (ref 0.10–4.00)

## 2022-02-07 LAB — VITAMIN D 25 HYDROXY (VIT D DEFICIENCY, FRACTURES): VITD: 33.42 ng/mL (ref 30.00–100.00)

## 2022-02-07 NOTE — Progress Notes (Signed)
Established Patient Office Visit     CC/Reason for Visit: Annual preventive exam  HPI: Marc Brown is a 62 y.o. male who is coming in today for the above mentioned reasons. Past Medical History is significant for: Impaired glucose tolerance, hyperlipidemia, vitamin D deficiency, obesity.  He has cut down his smoking to about three quarters of a pack a day.  He has routine eye and dental care.  He is overdue for shingles, RSV, COVID vaccination.  He had a colonoscopy in 2022   Past Medical/Surgical History: Past Medical History:  Diagnosis Date   ED (erectile dysfunction)    GERD (gastroesophageal reflux disease)    rare   Hx of adenomatous colonic polyps    Hyperlipidemia    denies   Obese    Recurrent boils    Tobacco abuse     Past Surgical History:  Procedure Laterality Date   CHOLECYSTECTOMY N/A 02/14/2013   Procedure: LAPAROSCOPIC CHOLECYSTECTOMY;  Surgeon: Adin Hector, MD;  Location: [place];  Service: General;  Laterality: N/A;   COLONOSCOPY     ELBOW FRACTURE SURGERY Left    left   WISDOM TOOTH EXTRACTION      Social History:  reports that he has been smoking cigarettes. He has a 20.00 pack-year smoking history. He has never used smokeless tobacco. He reports current alcohol use of about 8.0 standard drinks of alcohol per week. He reports that he does not use drugs.  Allergies: No Known Allergies  Family History:  Family History  Problem Relation Age of Onset   Kidney disease Mother    Hearing loss Mother        due to whooping cough   Heart disease Father    Obesity Father    Schizophrenia Father    Cancer - Other Sister        cancer of duodeum   Multiple sclerosis Brother    Hyperlipidemia Brother    Colon cancer Neg Hx    Esophageal cancer Neg Hx    Rectal cancer Neg Hx    Stomach cancer Neg Hx    Colon polyps Neg Hx      Current Outpatient Medications:    amitriptyline (ELAVIL) 50 MG tablet, Take 1 tablet (50 mg total) by mouth  at bedtime. Must keep appointment 02/07/22 for further refills, Disp: 90 tablet, Rfl: 0   aspirin 81 MG tablet, Take 81 mg by mouth at bedtime., Disp: , Rfl:    atorvastatin (LIPITOR) 10 MG tablet, Take 1 tablet (10 mg total) by mouth daily., Disp: 90 tablet, Rfl: 0   buPROPion (WELLBUTRIN XL) 150 MG 24 hr tablet, Take 1 tablet (150 mg total) by mouth daily., Disp: 90 tablet, Rfl: 0   Cholecalciferol (VITAMIN D3 PO), Take 1 tablet by mouth daily., Disp: , Rfl:    fluticasone (FLOVENT HFA) 44 MCG/ACT inhaler, Inhale 1 puff into the lungs 2 (two) times daily. (Patient taking differently: Inhale 1 puff into the lungs 2 (two) times daily. As needed), Disp: 2 Inhaler, Rfl: 6   ibuprofen (ADVIL) 200 MG tablet, Take 200 mg by mouth every 6 (six) hours as needed., Disp: , Rfl:    scopolamine (TRANSDERM-SCOP) 1 MG/3DAYS, Place 1 patch (1.5 mg total) onto the skin every 3 (three) days., Disp: 10 patch, Rfl: 12   sildenafil (VIAGRA) 100 MG tablet, Take 0.5-1 tablets (50-100 mg total) by mouth daily as needed for erectile dysfunction., Disp: 5 tablet, Rfl: 11  Review of Systems:  Negative unless indicated in HPI.   Physical Exam: Vitals:   02/07/22 0951  BP: 110/80  Pulse: 73  Temp: 97.9 F (36.6 C)  TempSrc: Oral  SpO2: 98%  Weight: 281 lb 9.6 oz (127.7 kg)  Height: 6' 0.5" (1.842 m)    Body mass index is 37.67 kg/m.   Physical Exam Vitals reviewed.  Constitutional:      General: He is not in acute distress.    Appearance: Normal appearance. He is not ill-appearing, toxic-appearing or diaphoretic.  HENT:     Head: Normocephalic.     Right Ear: Tympanic membrane, ear canal and external ear normal. There is no impacted cerumen.     Left Ear: Tympanic membrane, ear canal and external ear normal. There is no impacted cerumen.     Nose: Nose normal.     Mouth/Throat:     Mouth: Mucous membranes are moist.     Pharynx: Oropharynx is clear. No oropharyngeal exudate or posterior oropharyngeal  erythema.  Eyes:     General: No scleral icterus.       Right eye: No discharge.        Left eye: No discharge.     Conjunctiva/sclera: Conjunctivae normal.     Pupils: Pupils are equal, round, and reactive to light.  Neck:     Vascular: No carotid bruit.  Cardiovascular:     Rate and Rhythm: Normal rate and regular rhythm.     Pulses: Normal pulses.     Heart sounds: Normal heart sounds.  Pulmonary:     Effort: Pulmonary effort is normal. No respiratory distress.     Breath sounds: Normal breath sounds.  Abdominal:     General: Abdomen is flat. Bowel sounds are normal.     Palpations: Abdomen is soft.  Musculoskeletal:        General: Normal range of motion.     Cervical back: Normal range of motion.  Skin:    General: Skin is warm and dry.     Capillary Refill: Capillary refill takes less than 2 seconds.  Neurological:     General: No focal deficit present.     Mental Status: He is alert and oriented to person, place, and time. Mental status is at baseline.  Psychiatric:        Mood and Affect: Mood normal.        Behavior: Behavior normal.        Thought Content: Thought content normal.        Judgment: Judgment normal.       Impression and Plan:  Encounter for preventive health examination  Vitamin D deficiency  Metabolic syndrome - Plan: CBC with Differential/Platelet, Comprehensive metabolic panel, TSH, Vitamin B12, VITAMIN D 25 Hydroxy (Vit-D Deficiency, Fractures), VITAMIN D 25 Hydroxy (Vit-D Deficiency, Fractures), Vitamin B12, TSH, Comprehensive metabolic panel, CBC with Differential/Platelet  Mixed hyperlipidemia - Plan: Lipid panel, Lipid panel  IGT (impaired glucose tolerance) - Plan: Hemoglobin A1c, Hemoglobin A1c  Tobacco abuse - Plan: Ambulatory Referral Lung Cancer Screening [facility]  BPH associated with nocturia - Plan: PSA, PSA  Immunization due  -Recommend routine eye and dental care. -Healthy lifestyle discussed in detail. -Labs  to be updated today. -Prostate cancer screening: PSA today Health Maintenance  Topic Date Due   Screening for Lung Cancer  Never done   Zoster (Shingles) Vaccine (1 of 2) Never done   COVID-19 Vaccine (6 - 2023-24 season) 02/23/2022*   DTaP/Tdap/Td vaccine (3 - Td or Tdap) 05/14/2027  Colon Cancer Screening  10/08/2030   Flu Shot  Completed   Hepatitis C Screening: USPSTF Recommendation to screen - Ages 18-79 yo.  Completed   HIV Screening  Completed   HPV Vaccine  Aged Out  *Topic was postponed. The date shown is not the original due date.    -First shingles administered today.     Lelon Frohlich, MD  Primary [facility] at [facility]

## 2022-02-07 NOTE — Addendum Note (Signed)
Addended by: Westley Hummer B on: 02/07/2022 10:41 AM   Modules accepted: Orders

## 2022-02-08 ENCOUNTER — Encounter: Payer: Self-pay | Admitting: Internal Medicine

## 2022-02-09 ENCOUNTER — Other Ambulatory Visit (HOSPITAL_BASED_OUTPATIENT_CLINIC_OR_DEPARTMENT_OTHER): Payer: Self-pay

## 2022-02-09 MED ORDER — COMIRNATY 30 MCG/0.3ML IM SUSY
PREFILLED_SYRINGE | INTRAMUSCULAR | 0 refills | Status: DC
  Filled 2022-02-09: qty 0.3, 1d supply, fill #0

## 2022-02-19 ENCOUNTER — Other Ambulatory Visit (HOSPITAL_BASED_OUTPATIENT_CLINIC_OR_DEPARTMENT_OTHER): Payer: Self-pay

## 2022-02-19 MED ORDER — AREXVY 120 MCG/0.5ML IM SUSR
INTRAMUSCULAR | 0 refills | Status: DC
  Filled 2022-02-19: qty 0.5, 1d supply, fill #0

## 2022-03-16 ENCOUNTER — Other Ambulatory Visit: Payer: Self-pay | Admitting: Internal Medicine

## 2022-03-16 DIAGNOSIS — E782 Mixed hyperlipidemia: Secondary | ICD-10-CM

## 2022-03-20 ENCOUNTER — Encounter: Payer: Self-pay | Admitting: Internal Medicine

## 2022-03-20 ENCOUNTER — Ambulatory Visit: Payer: BC Managed Care – PPO | Admitting: Internal Medicine

## 2022-03-20 VITALS — BP 130/80 | HR 76 | Temp 97.9°F | Wt 276.0 lb

## 2022-03-20 DIAGNOSIS — J209 Acute bronchitis, unspecified: Secondary | ICD-10-CM | POA: Diagnosis not present

## 2022-03-20 DIAGNOSIS — R059 Cough, unspecified: Secondary | ICD-10-CM

## 2022-03-20 LAB — POCT INFLUENZA A/B
Influenza A, POC: NEGATIVE
Influenza B, POC: NEGATIVE

## 2022-03-20 LAB — POC COVID19 BINAXNOW: SARS Coronavirus 2 Ag: NEGATIVE

## 2022-03-20 MED ORDER — ALBUTEROL SULFATE HFA 108 (90 BASE) MCG/ACT IN AERS: 2.0000 | INHALATION_SPRAY | Freq: Four times a day (QID) | RESPIRATORY_TRACT | 2 refills | Status: AC | PRN

## 2022-03-20 MED ORDER — PREDNISONE 10 MG (21) PO TBPK: ORAL_TABLET | ORAL | 0 refills | Status: DC

## 2022-03-20 NOTE — Progress Notes (Signed)
Established Patient Office Visit     CC/Reason for Visit: Discuss acute concerns  HPI: Marc Brown is a 62 y.o. male who is coming in today for the above mentioned reasons.  Returned from a cruise last week.  5 days ago started experiencing cough, wheezing, congestion, postnasal drip.   Past Medical/Surgical History: Past Medical History:  Diagnosis Date   ED (erectile dysfunction)    GERD (gastroesophageal reflux disease)    rare   Hx of adenomatous colonic polyps    Hyperlipidemia    denies   Obese    Recurrent boils    Tobacco abuse     Past Surgical History:  Procedure Laterality Date   CHOLECYSTECTOMY N/A 02/14/2013   Procedure: LAPAROSCOPIC CHOLECYSTECTOMY;  Surgeon: Adin Hector, MD;  Location: [place];  Service: General;  Laterality: N/A;   COLONOSCOPY     ELBOW FRACTURE SURGERY Left    left   WISDOM TOOTH EXTRACTION      Social History:  reports that he has been smoking cigarettes. He has a 20.00 pack-year smoking history. He has never used smokeless tobacco. He reports current alcohol use of about 8.0 standard drinks of alcohol per week. He reports that he does not use drugs.  Allergies: No Known Allergies  Family History:  Family History  Problem Relation Age of Onset   Kidney disease Mother    Hearing loss Mother        due to whooping cough   Heart disease Father    Obesity Father    Schizophrenia Father    Cancer - Other Sister        cancer of duodeum   Multiple sclerosis Brother    Hyperlipidemia Brother    Colon cancer Neg Hx    Esophageal cancer Neg Hx    Rectal cancer Neg Hx    Stomach cancer Neg Hx    Colon polyps Neg Hx      Current Outpatient Medications:    albuterol (VENTOLIN HFA) 108 (90 Base) MCG/ACT inhaler, Inhale 2 puffs into the lungs every 6 (six) hours as needed for wheezing or shortness of breath., Disp: 8 g, Rfl: 2   amitriptyline (ELAVIL) 50 MG tablet, Take 1 tablet (50 mg total) by mouth at bedtime. Must  keep appointment 02/07/22 for further refills, Disp: 90 tablet, Rfl: 0   aspirin 81 MG tablet, Take 81 mg by mouth at bedtime., Disp: , Rfl:    atorvastatin (LIPITOR) 10 MG tablet, TAKE 1 TABLET BY MOUTH EVERY DAY, Disp: 90 tablet, Rfl: 1   buPROPion (WELLBUTRIN XL) 150 MG 24 hr tablet, Take 1 tablet (150 mg total) by mouth daily., Disp: 90 tablet, Rfl: 0   Cholecalciferol (VITAMIN D3 PO), Take 1 tablet by mouth daily., Disp: , Rfl:    fluticasone (FLOVENT HFA) 44 MCG/ACT inhaler, Inhale 1 puff into the lungs 2 (two) times daily. (Patient taking differently: Inhale 1 puff into the lungs 2 (two) times daily. As needed), Disp: 2 Inhaler, Rfl: 6   ibuprofen (ADVIL) 200 MG tablet, Take 200 mg by mouth every 6 (six) hours as needed., Disp: , Rfl:    predniSONE (STERAPRED UNI-PAK 21 TAB) 10 MG (21) TBPK tablet, Take as directed, Disp: 21 tablet, Rfl: 0   RSV vaccine recomb adjuvanted (AREXVY) 120 MCG/0.5ML injection, Inject into the muscle., Disp: 0.5 mL, Rfl: 0   scopolamine (TRANSDERM-SCOP) 1 MG/3DAYS, Place 1 patch (1.5 mg total) onto the skin every 3 (three) days., Disp:  10 patch, Rfl: 12   sildenafil (VIAGRA) 100 MG tablet, Take 0.5-1 tablets (50-100 mg total) by mouth daily as needed for erectile dysfunction., Disp: 5 tablet, Rfl: 11  Review of Systems:  Negative unless indicated in HPI.   Physical Exam: Vitals:   03/20/22 1545  BP: 130/80  Pulse: 76  Temp: 97.9 F (36.6 C)  TempSrc: Oral  SpO2: 98%  Weight: 276 lb (125.2 kg)    Body mass index is 36.92 kg/m.   Physical Exam Vitals reviewed.  Constitutional:      Appearance: Normal appearance.  HENT:     Right Ear: Tympanic membrane, ear canal and external ear normal.     Left Ear: Tympanic membrane, ear canal and external ear normal.     Mouth/Throat:     Mouth: Mucous membranes are moist.     Pharynx: Oropharynx is clear.  Eyes:     Conjunctiva/sclera: Conjunctivae normal.     Pupils: Pupils are equal, round, and reactive  to light.  Cardiovascular:     Rate and Rhythm: Normal rate and regular rhythm.  Pulmonary:     Effort: Pulmonary effort is normal.     Breath sounds: Examination of the right-upper field reveals wheezing and rhonchi. Examination of the left-upper field reveals wheezing and rhonchi. Examination of the right-middle field reveals wheezing and rhonchi. Examination of the left-middle field reveals wheezing and rhonchi. Examination of the right-lower field reveals wheezing and rhonchi. Examination of the left-lower field reveals wheezing and rhonchi. Wheezing and rhonchi present.  Neurological:     Mental Status: He is alert.      Impression and Plan:  Cough, unspecified type - Plan: POC Influenza A/B, POC COVID-19  Acute bronchitis, unspecified organism - Plan: albuterol (VENTOLIN HFA) 108 (90 Base) MCG/ACT inhaler, predniSONE (STERAPRED UNI-PAK 21 TAB) 10 MG (21) TBPK tablet  -In office flu and COVID tests are negative. -With wheezing and rhonchi, suspect a component of reactive airway disease due to his prolonged coughing.  Will send in prescription for prednisone and albuterol inhaler.  See no need for antibiotics at this time given the timeframe.  Time spent:30 minutes reviewing chart, interviewing and examining patient and formulating plan of care.     Lelon Frohlich, MD [facility]

## 2022-03-27 ENCOUNTER — Other Ambulatory Visit: Payer: Self-pay | Admitting: Internal Medicine

## 2022-03-27 DIAGNOSIS — Z720 Tobacco use: Secondary | ICD-10-CM

## 2022-04-19 ENCOUNTER — Ambulatory Visit (INDEPENDENT_AMBULATORY_CARE_PROVIDER_SITE_OTHER): Payer: BC Managed Care – PPO | Admitting: *Deleted

## 2022-04-19 ENCOUNTER — Ambulatory Visit: Payer: BC Managed Care – PPO

## 2022-04-19 DIAGNOSIS — Z23 Encounter for immunization: Secondary | ICD-10-CM

## 2022-05-10 ENCOUNTER — Other Ambulatory Visit: Payer: Self-pay | Admitting: Internal Medicine

## 2022-05-10 DIAGNOSIS — R6884 Jaw pain: Secondary | ICD-10-CM

## 2022-06-21 ENCOUNTER — Other Ambulatory Visit: Payer: Self-pay | Admitting: Internal Medicine

## 2022-06-21 DIAGNOSIS — Z720 Tobacco use: Secondary | ICD-10-CM

## 2022-08-05 ENCOUNTER — Other Ambulatory Visit: Payer: Self-pay | Admitting: Internal Medicine

## 2022-08-05 DIAGNOSIS — R6884 Jaw pain: Secondary | ICD-10-CM

## 2022-09-15 ENCOUNTER — Other Ambulatory Visit: Payer: Self-pay | Admitting: Internal Medicine

## 2022-09-15 DIAGNOSIS — E782 Mixed hyperlipidemia: Secondary | ICD-10-CM

## 2022-10-16 ENCOUNTER — Other Ambulatory Visit (HOSPITAL_BASED_OUTPATIENT_CLINIC_OR_DEPARTMENT_OTHER): Payer: Self-pay

## 2022-10-16 MED ORDER — COMIRNATY 30 MCG/0.3ML IM SUSY
0.3000 mL | PREFILLED_SYRINGE | Freq: Once | INTRAMUSCULAR | 0 refills | Status: AC
  Filled 2022-10-16: qty 0.3, 1d supply, fill #0

## 2022-10-16 MED ORDER — FLULAVAL 0.5 ML IM SUSY
0.5000 mL | PREFILLED_SYRINGE | Freq: Once | INTRAMUSCULAR | 0 refills | Status: AC
  Filled 2022-10-16: qty 0.5, 1d supply, fill #0

## 2022-10-19 ENCOUNTER — Other Ambulatory Visit: Payer: Self-pay | Admitting: Internal Medicine

## 2022-10-19 DIAGNOSIS — Z720 Tobacco use: Secondary | ICD-10-CM

## 2022-11-03 ENCOUNTER — Other Ambulatory Visit: Payer: Self-pay | Admitting: Internal Medicine

## 2022-11-03 DIAGNOSIS — R6884 Jaw pain: Secondary | ICD-10-CM

## 2022-11-05 ENCOUNTER — Ambulatory Visit: Payer: BC Managed Care – PPO | Admitting: Family Medicine

## 2022-11-05 ENCOUNTER — Encounter: Payer: Self-pay | Admitting: Family Medicine

## 2022-11-05 VITALS — BP 126/80 | HR 88 | Temp 98.0°F | Ht 72.5 in | Wt 278.2 lb

## 2022-11-05 DIAGNOSIS — L03115 Cellulitis of right lower limb: Secondary | ICD-10-CM | POA: Diagnosis not present

## 2022-11-05 MED ORDER — DOXYCYCLINE HYCLATE 100 MG PO TABS: 100.0000 mg | ORAL_TABLET | Freq: Two times a day (BID) | ORAL | 0 refills | Status: AC

## 2022-11-05 NOTE — Progress Notes (Signed)
   Acute Office Visit   Subjective:  Patient ID: Marc Brown, male    DOB: 01-19-1960, 62 y.o.   MRN: [id number]  Chief Complaint  Patient presents with   Cellulitis    Noticed on Saturday, had a temp of 100.7, had been working out in the yard. Noticed the redness/heat on the right lower leg. Has had some nausea but no vomiting.     HPI Patient is here for an acute visit. He reports he was working out in the yard on Saturday. [place] Saturday night, he developed a fever, 100.7, with nausea, no vomiting. He reports he noticed redness and heated area on his right lower leg. Yesterday, he reports he slept most of the day.   He denies any other symptoms, respiratory or urinary symptoms.  ROS See HPI above      Objective:   BP 126/80 (BP Location: Right Arm, Patient Position: Sitting, Cuff Size: Large)   Pulse 88   Temp 98 F (36.7 C) (Oral)   Ht 6' 0.5" (1.842 m)   Wt 278 lb 4 oz (126.2 kg)   SpO2 96%   BMI 37.22 kg/m    Physical Exam Vitals reviewed.  Constitutional:      General: He is not in acute distress.    Appearance: Normal appearance. He is obese. He is not ill-appearing, toxic-appearing or diaphoretic.  HENT:     Head: Normocephalic and atraumatic.  Eyes:     General:        Right eye: No discharge.        Left eye: No discharge.     Conjunctiva/sclera: Conjunctivae normal.  Cardiovascular:     Rate and Rhythm: Normal rate.  Pulmonary:     Effort: Pulmonary effort is normal. No respiratory distress.  Musculoskeletal:        General: Normal range of motion.  Skin:    General: Skin is warm and dry.     Comments: Right lower leg: Please see picture included; red, slight scaly skin. Warm to touch compared to the left leg.   Neurological:     General: No focal deficit present.     Mental Status: He is alert and oriented to person, place, and time. Mental status is at baseline.  Psychiatric:        Mood and Affect: Mood normal.         Behavior: Behavior normal.        Thought Content: Thought content normal.        Judgment: Judgment normal.          Assessment & Plan:  Cellulitis of right lower extremity -     Doxycycline Hyclate; Take 1 tablet (100 mg total) by mouth 2 (two) times daily for 7 days.  Dispense: 14 tablet; Refill: 0  -Prescribed Doxycycline 100mg  tablet, take 1 tablet every 12 hours for 7 days.  -Provided information about cellulitis.  -Recommend to cleanse area with Dial Antibacterial Soap and pat dry. Keep area clean.  -If symptoms do not improve, become worse, or new symptoms occur; please follow up with either Dr. Ardyth Harps or this provider.  Zandra Abts, NP

## 2022-11-05 NOTE — Patient Instructions (Signed)
-  It was a pleasure to care for you today.  -Prescribed Doxycycline 100mg  tablet, take 1 tablet every 12 hours for 7 days.  -Provided information about cellulitis.  -Recommend to cleanse area with Dial Antibacterial Soap and pat dry. Keep area clean.  -If symptoms do not improve, become worse, or new symptoms occur; please follow up with either Dr. Ardyth Harps or this provider.

## 2022-11-12 ENCOUNTER — Other Ambulatory Visit: Payer: Self-pay | Admitting: Internal Medicine

## 2022-11-12 ENCOUNTER — Ambulatory Visit: Payer: BC Managed Care – PPO | Admitting: Internal Medicine

## 2022-11-12 ENCOUNTER — Encounter: Payer: Self-pay | Admitting: Internal Medicine

## 2022-11-12 VITALS — BP 122/80 | HR 100 | Temp 98.4°F | Wt 282.3 lb

## 2022-11-12 DIAGNOSIS — F17210 Nicotine dependence, cigarettes, uncomplicated: Secondary | ICD-10-CM

## 2022-11-12 MED ORDER — VARENICLINE TARTRATE (STARTER) 0.5 MG X 11 & 1 MG X 42 PO TBPK: ORAL_TABLET | ORAL | 0 refills | Status: DC

## 2022-11-12 MED ORDER — VARENICLINE TARTRATE 1 MG PO TABS: 1.0000 mg | ORAL_TABLET | Freq: Two times a day (BID) | ORAL | 2 refills | Status: DC

## 2022-11-12 NOTE — Progress Notes (Signed)
Established Patient Office Visit     CC/Reason for Visit: Discuss smoking cessation  HPI: Marc Brown is a 62 y.o. male who is coming in today for the above mentioned reasons.  He has been smoking a pack a day for over 30 years.  He tried Wellbutrin without much success.  Is interested in trying Chantix.  Is otherwise doing well.  He was recently treated for a lower extremity cellulitis and has improved.  Past Medical/Surgical History: Past Medical History:  Diagnosis Date   ED (erectile dysfunction)    GERD (gastroesophageal reflux disease)    rare   Hx of adenomatous colonic polyps    Hyperlipidemia    denies   Obese    Recurrent boils    Tobacco abuse     Past Surgical History:  Procedure Laterality Date   CHOLECYSTECTOMY N/A 02/14/2013   Procedure: LAPAROSCOPIC CHOLECYSTECTOMY;  Surgeon: Ernestene Mention, MD;  Location: [facility] OR;  Service: General;  Laterality: N/A;   COLONOSCOPY     ELBOW FRACTURE SURGERY Left    left   WISDOM TOOTH EXTRACTION      Social History:  reports that he has been smoking cigarettes. He has a 20 pack-year smoking history. He has never used smokeless tobacco. He reports current alcohol use of about 8.0 standard drinks of alcohol per week. He reports that he does not use drugs.  Allergies: No Known Allergies  Family History:  Family History  Problem Relation Age of Onset   Kidney disease Mother    Hearing loss Mother        due to whooping cough   Heart disease Father    Obesity Father    Schizophrenia Father    Cancer - Other Sister        cancer of duodeum   Multiple sclerosis Brother    Hyperlipidemia Brother    Colon cancer Neg Hx    Esophageal cancer Neg Hx    Rectal cancer Neg Hx    Stomach cancer Neg Hx    Colon polyps Neg Hx      Current Outpatient Medications:    albuterol (VENTOLIN HFA) 108 (90 Base) MCG/ACT inhaler, Inhale 2 puffs into the lungs every 6 (six) hours as needed for wheezing or shortness of  breath., Disp: 8 g, Rfl: 2   amitriptyline (ELAVIL) 50 MG tablet, TAKE 1 TABLET BY MOUTH AT BEDTIME, Disp: 90 tablet, Rfl: 0   aspirin 81 MG tablet, Take 81 mg by mouth at bedtime., Disp: , Rfl:    atorvastatin (LIPITOR) 10 MG tablet, TAKE 1 TABLET BY MOUTH EVERY DAY, Disp: 90 tablet, Rfl: 1   buPROPion (WELLBUTRIN XL) 150 MG 24 hr tablet, TAKE 1 TABLET BY MOUTH EVERY DAY, Disp: 90 tablet, Rfl: 1   Cholecalciferol (VITAMIN D3 PO), Take 1 tablet by mouth daily., Disp: , Rfl:    doxycycline (VIBRA-TABS) 100 MG tablet, Take 1 tablet (100 mg total) by mouth 2 (two) times daily for 7 days., Disp: 14 tablet, Rfl: 0   fluticasone (FLOVENT HFA) 44 MCG/ACT inhaler, Inhale 1 puff into the lungs 2 (two) times daily. (Patient taking differently: Inhale 1 puff into the lungs 2 (two) times daily. As needed), Disp: 2 Inhaler, Rfl: 6   ibuprofen (ADVIL) 200 MG tablet, Take 200 mg by mouth every 6 (six) hours as needed., Disp: , Rfl:    scopolamine (TRANSDERM-SCOP) 1 MG/3DAYS, Place 1 patch (1.5 mg total) onto the skin every 3 (three) days.,  Disp: 10 patch, Rfl: 12   sildenafil (VIAGRA) 100 MG tablet, Take 0.5-1 tablets (50-100 mg total) by mouth daily as needed for erectile dysfunction., Disp: 5 tablet, Rfl: 11   varenicline (CHANTIX) 1 MG tablet, Take 1 tablet (1 mg total) by mouth 2 (two) times daily., Disp: 60 tablet, Rfl: 2   Varenicline Tartrate, Starter, 0.5 MG X 11 & 1 MG X 42 TBPK, Take as directed, Disp: 53 each, Rfl: 0  Review of Systems:  Negative unless indicated in HPI.   Physical Exam: Vitals:   11/12/22 1134  BP: 122/80  Pulse: 100  Temp: 98.4 F (36.9 C)  TempSrc: Oral  SpO2: 98%  Weight: 282 lb 4.8 oz (128.1 kg)    Body mass index is 37.76 kg/m.   Physical Exam Vitals reviewed.  Constitutional:      Appearance: Normal appearance.  HENT:     Head: Normocephalic and atraumatic.  Eyes:     Conjunctiva/sclera: Conjunctivae normal.     Pupils: Pupils are equal, round, and  reactive to light.  Skin:    General: Skin is warm and dry.  Neurological:     General: No focal deficit present.     Mental Status: He is alert and oriented to person, place, and time.  Psychiatric:        Mood and Affect: Mood normal.        Behavior: Behavior normal.        Thought Content: Thought content normal.        Judgment: Judgment normal.      Impression and Plan:  Cigarette nicotine dependence without complication -     Varenicline Tartrate (Starter); Take as directed  Dispense: 53 each; Refill: 0 -     Varenicline Tartrate; Take 1 tablet (1 mg total) by mouth 2 (two) times daily.  Dispense: 60 tablet; Refill: 2  -Try Chantix.  Will also set up follow-up with in-house pharmacist for frequent check ins.   Time spent:31 minutes reviewing chart, interviewing and examining patient and formulating plan of care.     [name], MD [facility]

## 2022-11-13 ENCOUNTER — Telehealth: Payer: Self-pay

## 2022-11-13 NOTE — Progress Notes (Signed)
   Care Guide Note  11/13/2022 Name: [name] MRN: [id number] DOB: 08/15/1960  Referred by: Philip Aspen, Limmie Patricia, MD Reason for referral : Care Coordination (Outreach to schedule with pharm d )   Marc Brown is a 62 y.o. year old male who is a primary care patient of Philip Aspen, Limmie Patricia, MD. Ernst Spell was referred to the pharmacist for assistance related to  Smoking  .    Successful contact was made with the patient to discuss pharmacy services including being ready for the pharmacist to call at least 5 minutes before the scheduled appointment time, to have medication bottles and any blood sugar or blood pressure readings ready for review. The patient agreed to meet with the pharmacist via with the pharmacist via telephone visit on (date/time).  12/10/2022  Penne Lash, RMA Care Guide [facility]  [place], [place] [address] Direct Dial: [phone] [email or web address]

## 2022-12-07 ENCOUNTER — Other Ambulatory Visit: Payer: Self-pay | Admitting: Internal Medicine

## 2022-12-07 DIAGNOSIS — F17210 Nicotine dependence, cigarettes, uncomplicated: Secondary | ICD-10-CM

## 2022-12-10 ENCOUNTER — Other Ambulatory Visit: Payer: BC Managed Care – PPO

## 2022-12-10 NOTE — Progress Notes (Signed)
12/10/2022 Name: Marc Brown MRN: [id number] DOB: 04-12-1960  Chief Complaint  Patient presents with   Medication Management    Smoking Cessation    Marc Brown is a 62 y.o. year old male who presented for a telephone visit.   They were referred to the pharmacist by their PCP for assistance in managing  smoking cessation .    Subjective:  Care Team: Primary Care Provider: Philip Brown, Marc Patricia, MD   Medication Access/Adherence  Current Pharmacy:  [facility] [phone] [place], [place] - [address] [place] [address] Phone: [phone] Fax: [phone]   Patient reports affordability concerns with their medications: No  Patient reports access/transportation concerns to their pharmacy: No  Patient reports adherence concerns with their medications:  No     Tobacco Abuse:  Tobacco Use History: Number of cigarettes per day 20 Smokes first cigarette within 30 minutes after waking  Current Medications: Chantix 0.5mg  (started 12/09/22), Wellbutrin XL 150mg  once daily Hx of nicotine gum once for a long flight   Objective:  Lab Results  Component Value Date   HGBA1C 6.1 02/07/2022    Lab Results  Component Value Date   CREATININE 0.97 02/07/2022   BUN 16 02/07/2022   NA 136 02/07/2022   K 4.5 02/07/2022   CL 104 02/07/2022   CO2 22 02/07/2022    Lab Results  Component Value Date   CHOL 118 02/07/2022   HDL 35.10 (L) 02/07/2022   LDLCALC 65 02/07/2022   TRIG 86.0 02/07/2022   CHOLHDL 3 02/07/2022    Medications Reviewed Today     Reviewed by Marc Brown, RPH (Pharmacist) on 12/10/22 at 1350  Med List Status: <None>   Medication Order Taking? Sig Documenting Provider Last Dose Status Informant  albuterol (VENTOLIN HFA) 108 (90 Base) MCG/ACT inhaler [id number] Yes Inhale 2 puffs into the lungs every 6 (six) hours as needed for wheezing or shortness of breath. Marc Brown, Marc Patricia, MD Taking Active    amitriptyline (ELAVIL) 50 MG tablet [id number] Yes TAKE 1 TABLET BY MOUTH AT BEDTIME Marc Brown, Marc Patricia, MD Taking Active   aspirin 81 MG tablet [id number] Yes Take 81 mg by mouth at bedtime. [provider] Taking Active Self  atorvastatin (LIPITOR) 10 MG tablet [id number] Yes TAKE 1 TABLET BY MOUTH EVERY DAY Marc Brown, Marc Patricia, MD Taking Active   buPROPion (WELLBUTRIN XL) 150 MG 24 hr tablet [id number] Yes TAKE 1 TABLET BY MOUTH EVERY DAY Marc Brown, Marc Patricia, MD Taking Active   Cholecalciferol (VITAMIN D3 PO) [id number] Yes Take 1 tablet by mouth daily. [provider] Taking Active   fluticasone (FLOVENT HFA) 44 MCG/ACT inhaler [id number] Yes Inhale 1 puff into the lungs 2 (two) times daily.  Patient taking differently: Inhale 1 puff into the lungs 2 (two) times daily. As needed   Roderick Pee, MD Taking Active   ibuprofen (ADVIL) 200 MG tablet [id number] Yes Take 200 mg by mouth every 6 (six) hours as needed. [provider] Taking Active   scopolamine (TRANSDERM-SCOP) 1 MG/3DAYS [id number] Yes Place 1 patch (1.5 mg total) onto the skin every 3 (three) days. Marc Brown, Marc Patricia, MD Taking Active   sildenafil (VIAGRA) 100 MG tablet [id number] Yes Take 0.5-1 tablets (50-100 mg total) by mouth daily as needed for erectile dysfunction. Marc Brown, Marc Patricia, MD Taking Active   varenicline (CHANTIX) 1 MG tablet [id number] Yes Take 1 tablet (1 mg total) by  mouth 2 (two) times daily. Marc Brown, Marc Patricia, MD Taking Active   Varenicline Tartrate, Starter, 0.5 MG X 11 & 1 MG X 42 TBPK [id number] Yes TAKE AS DIRECTED Marc Brown, Marc Patricia, MD Taking Active               Assessment/Plan:   Tobacco Abuse - Currently uncontrolled - Provided motivational interviewing to assess tobacco use and strategies for reduction - Provided information on 1 800 QUIT NOW support program -Continue varenicline 0.5 mg daily for 1 more day, then 0.5 mg  twice daily for 4 days, then 1 mg twice daily thereafter. Counseled to take with food to reduce risk of GI adverse effects. Also counseled to stop therapy and contact provider if change in mood.   Follow Up Plan: 4 weeks  Marc Brown, PharmD Clinical Pharmacist [phone]

## 2023-01-03 ENCOUNTER — Other Ambulatory Visit: Payer: Self-pay | Admitting: Internal Medicine

## 2023-01-03 DIAGNOSIS — F17210 Nicotine dependence, cigarettes, uncomplicated: Secondary | ICD-10-CM

## 2023-01-07 ENCOUNTER — Other Ambulatory Visit (INDEPENDENT_AMBULATORY_CARE_PROVIDER_SITE_OTHER): Payer: 59

## 2023-01-07 DIAGNOSIS — F17210 Nicotine dependence, cigarettes, uncomplicated: Secondary | ICD-10-CM

## 2023-01-07 NOTE — Progress Notes (Signed)
 01/07/2023 Name: Marc Brown MRN: [id number] DOB: 1960/11/05  Chief Complaint  Patient presents with   Medication Management    Smoking Cessation    Marc Brown is a 63 y.o. year old male who presented for a telephone visit.   They were referred to the pharmacist by their PCP for assistance in managing  smoking cessation .    Subjective:  Care Team: Primary Care Provider: Theophilus Andrews, Tully GRADE, MD   Medication Access/Adherence  [facility]acy:  [phone] [place], [place] - [address] [place] [address] Phone: [phone] Fax: [phone]   Patient reports affordability concerns with their medications: No  Patient reports access/transportation concerns to their pharmacy: No  Patient reports adherence concerns with their medications:  No     Tobacco Abuse:  Tobacco Use History: Number of cigarettes per day 15 (more like half a cigarette each time) Smokes first cigarette within 30 minutes after waking  Current Medications: Chantix  1mg  BID, Wellbutrin  XL 150mg  once daily Hx of nicotine gum once for a long flight  -Did experience some nausea with the morning dose but this is avoided with food on his stomach -Notes some increased mild irritability   Objective:  Lab Results  Component Value Date   HGBA1C 6.1 02/07/2022    Lab Results  Component Value Date   CREATININE 0.97 02/07/2022   BUN 16 02/07/2022   NA 136 02/07/2022   K 4.5 02/07/2022   CL 104 02/07/2022   CO2 22 02/07/2022    Lab Results  Component Value Date   CHOL 118 02/07/2022   HDL 35.10 (L) 02/07/2022   LDLCALC 65 02/07/2022   TRIG 86.0 02/07/2022   CHOLHDL 3 02/07/2022    Medications Reviewed Today     Reviewed by Lionell Jon DEL, RPH (Pharmacist) on 01/07/23 at 1411  Med List Status: <None>   Medication Order Taking? Sig Documenting Provider Last Dose Status Informant  albuterol  (VENTOLIN  HFA) 108 (90 Base) MCG/ACT inhaler  [id number]  Inhale 2 puffs into the lungs every 6 (six) hours as needed for wheezing or shortness of breath. Theophilus Andrews, Tully GRADE, MD  Active   amitriptyline  (ELAVIL ) 50 MG tablet [id number]  TAKE 1 TABLET BY MOUTH AT BEDTIME Theophilus Andrews, Tully GRADE, MD  Active   aspirin 81 MG tablet [id number]  Take 81 mg by mouth at bedtime. [provider]  Active Self  atorvastatin  (LIPITOR) 10 MG tablet [id number]  TAKE 1 TABLET BY MOUTH EVERY DAY Theophilus Andrews, Tully GRADE, MD  Active   buPROPion  (WELLBUTRIN  XL) 150 MG 24 hr tablet [id number]  TAKE 1 TABLET BY MOUTH EVERY DAY Theophilus Andrews, Estela Y, MD  Active   Cholecalciferol (VITAMIN D3 PO) [id number]  Take 1 tablet by mouth daily. [provider]  Active   fluticasone  (FLOVENT  HFA) 44 MCG/ACT inhaler [id number]  Inhale 1 puff into the lungs 2 (two) times daily.  Patient taking differently: Inhale 1 puff into the lungs 2 (two) times daily. As needed   Krystal Reyes LABOR, MD  Active   ibuprofen (ADVIL) 200 MG tablet [id number]  Take 200 mg by mouth every 6 (six) hours as needed. [provider]  Active   scopolamine  (TRANSDERM-SCOP) 1 MG/3DAYS [id number]  Place 1 patch (1.5 mg total) onto the skin every 3 (three) days. Theophilus Andrews, Tully GRADE, MD  Active   sildenafil  (VIAGRA ) 100 MG tablet [id number]  Take 0.5-1 tablets (50-100 mg total) by mouth  daily as needed for erectile dysfunction. Theophilus Andrews, Tully GRADE, MD  Active   varenicline  (CHANTIX ) 1 MG tablet [id number] Yes TAKE 1 TABLET BY MOUTH TWICE A DAY Theophilus Andrews, Tully GRADE, MD Taking Active               Assessment/Plan:   Tobacco Abuse - Currently uncontrolled - Provided motivational interviewing to assess tobacco use and strategies for reduction - Provided information on 1 800 QUIT NOW support program -Continue varenicline  1mg  BID. Counseled that the mild increased irritability could be from nicotine withdrawal effects as well. Suggested NRT but patient  would like to avoid that path for now.  -Set goal of being down to 10 cigarettes in 2 weeks.   Follow Up Plan: 2 weeks  Jon VEAR Lindau, PharmD Clinical Pharmacist [phone]

## 2023-01-21 ENCOUNTER — Other Ambulatory Visit: Payer: 59

## 2023-01-21 DIAGNOSIS — Z720 Tobacco use: Secondary | ICD-10-CM

## 2023-01-21 NOTE — Progress Notes (Signed)
01/21/2023 Name: Marc Brown MRN: [id number] DOB: September 15, 1960  Chief Complaint  Patient presents with   Medication Management    Smoking Cessation    Marc Brown is a 63 y.o. year old male who presented for a telephone visit.   They were referred to the pharmacist by their PCP for assistance in managing  smoking cessation .    Subjective:  Care Team: Primary Care Provider: Philip Aspen, Limmie Patricia, MD   Medication Access/Adherence  [facility]:  [phone] [place], [place] - [address] [place] [address] Phone: [phone] Fax: [phone]   Patient reports affordability concerns with their medications: No  Patient reports access/transportation concerns to their pharmacy: No  Patient reports adherence concerns with their medications:  No     Tobacco Abuse:  Tobacco Use History: Number of cigarettes per day 13 on avg (some days less, some days more) (more like half a cigarette each time) Smokes first cigarette within 30 minutes after waking but reports cravings have overall declined significantly  Current Medications: Chantix 1mg  BID, Wellbutrin XL 150mg  once daily Hx of nicotine gum once for a long flight  -Did experience some nausea with the morning dose but this is avoided with food on his stomach -Notes some increased mild irritability -Has experienced vivid dreams but reports they are manageable at this time   Objective:  Lab Results  Component Value Date   HGBA1C 6.1 02/07/2022    Lab Results  Component Value Date   CREATININE 0.97 02/07/2022   BUN 16 02/07/2022   NA 136 02/07/2022   K 4.5 02/07/2022   CL 104 02/07/2022   CO2 22 02/07/2022    Lab Results  Component Value Date   CHOL 118 02/07/2022   HDL 35.10 (L) 02/07/2022   LDLCALC 65 02/07/2022   TRIG 86.0 02/07/2022   CHOLHDL 3 02/07/2022    Medications Reviewed Today     Reviewed by Sherrill Raring, RPH (Pharmacist) on 01/21/23 at  1430  Med List Status: <None>   Medication Order Taking? Sig Documenting Provider Last Dose Status Informant  albuterol (VENTOLIN HFA) 108 (90 Base) MCG/ACT inhaler [id number] No Inhale 2 puffs into the lungs every 6 (six) hours as needed for wheezing or shortness of breath. Philip Aspen, Limmie Patricia, MD Taking Active   amitriptyline (ELAVIL) 50 MG tablet [id number] No TAKE 1 TABLET BY MOUTH AT BEDTIME Philip Aspen, Limmie Patricia, MD Taking Active   aspirin 81 MG tablet [id number] No Take 81 mg by mouth at bedtime. [provider] Taking Active Self  atorvastatin (LIPITOR) 10 MG tablet [id number] No TAKE 1 TABLET BY MOUTH EVERY DAY Philip Aspen, Limmie Patricia, MD Taking Active   buPROPion (WELLBUTRIN XL) 150 MG 24 hr tablet [id number] No TAKE 1 TABLET BY MOUTH EVERY DAY Philip Aspen, Limmie Patricia, MD Taking Active   Cholecalciferol (VITAMIN D3 PO) [id number] No Take 1 tablet by mouth daily. [provider] Taking Active   fluticasone (FLOVENT HFA) 44 MCG/ACT inhaler [id number] No Inhale 1 puff into the lungs 2 (two) times daily.  Patient taking differently: Inhale 1 puff into the lungs 2 (two) times daily. As needed   Roderick Pee, MD Taking Active   ibuprofen (ADVIL) 200 MG tablet [id number] No Take 200 mg by mouth every 6 (six) hours as needed. [provider] Taking Active   scopolamine (TRANSDERM-SCOP) 1 MG/3DAYS [id number] No Place 1 patch (1.5 mg total) onto the skin every  3 (three) days. Philip Aspen, Limmie Patricia, MD Taking Active   sildenafil (VIAGRA) 100 MG tablet [id number] No Take 0.5-1 tablets (50-100 mg total) by mouth daily as needed for erectile dysfunction. Philip Aspen, Limmie Patricia, MD Taking Active   varenicline (CHANTIX) 1 MG tablet [id number] No TAKE 1 TABLET BY MOUTH TWICE A DAY Philip Aspen, Limmie Patricia, MD Taking Active               Assessment/Plan:   Tobacco Abuse - Currently uncontrolled -Continue varenicline 1mg  BID. Counseled that the mild  increased irritability could be from nicotine withdrawal effects as well. Suggested NRT but patient would like to avoid that path for now.  -Set goal of being of being down to 10 cigarettes or less in 2 weeks. Set quit date of 03/09/23.   Follow Up Plan: 2 weeks  Sherrill Raring, PharmD Clinical Pharmacist [phone]

## 2023-01-30 ENCOUNTER — Other Ambulatory Visit: Payer: Self-pay | Admitting: Internal Medicine

## 2023-01-30 DIAGNOSIS — R6884 Jaw pain: Secondary | ICD-10-CM

## 2023-02-04 ENCOUNTER — Other Ambulatory Visit: Payer: 59

## 2023-02-04 DIAGNOSIS — Z720 Tobacco use: Secondary | ICD-10-CM

## 2023-02-04 NOTE — Progress Notes (Signed)
02/04/2023 Name: Marc Brown MRN: [id number] DOB: [birth year]  Chief Complaint  Patient presents with   Nicotine Dependence    Marc Brown is a 63 y.o. year old male who presented for a telephone visit.   They were referred to the pharmacist by their PCP for assistance in managing  smoking cessation .    Subjective:  Care Team: Primary Care Provider: Philip Aspen, Limmie Patricia, MD   Medication Access/Adherence  [facility]:  [phone] [place], [place] - [address] [address] Phone: [phone] Fax: [phone]   Patient reports affordability concerns with their medications: No  Patient reports access/transportation concerns to their pharmacy: No  Patient reports adherence concerns with their medications:  No     Tobacco Abuse:  Tobacco Use History: Number of cigarettes per day 12-13 on avg (some days less, some days more) (more like half a cigarette each time) Smokes first cigarette within 30 minutes after waking but reports cravings have overall declined significantly  Current Medications: Chantix 1mg  BID, Wellbutrin XL 150mg  once daily Hx of nicotine gum once for a long flight  -Did experience some nausea with the morning dose but this is avoided with food on his stomach -Notes some increased mild irritability -Has experienced vivid dreams but reports they are manageable at this time   Objective:  Lab Results  Component Value Date   HGBA1C 6.1 02/07/2022    Lab Results  Component Value Date   CREATININE 0.97 02/07/2022   BUN 16 02/07/2022   NA 136 02/07/2022   K 4.5 02/07/2022   CL 104 02/07/2022   CO2 22 02/07/2022    Lab Results  Component Value Date   CHOL 118 02/07/2022   HDL 35.10 (L) 02/07/2022   LDLCALC 65 02/07/2022   TRIG 86.0 02/07/2022   CHOLHDL 3 02/07/2022    Medications Reviewed Today     Reviewed by Sherrill Raring, RPH (Pharmacist) on 02/04/23 at 1032  Med List Status:  <None>   Medication Order Taking? Sig Documenting Provider Last Dose Status Informant  albuterol (VENTOLIN HFA) 108 (90 Base) MCG/ACT inhaler [id number] No Inhale 2 puffs into the lungs every 6 (six) hours as needed for wheezing or shortness of breath. Philip Aspen, Limmie Patricia, MD Taking Active   amitriptyline (ELAVIL) 50 MG tablet [id number]  TAKE 1 TABLET BY MOUTH EVERYDAY AT BEDTIME Philip Aspen, Limmie Patricia, MD  Active   aspirin 81 MG tablet [id number] No Take 81 mg by mouth at bedtime. [provider] Taking Active Self  atorvastatin (LIPITOR) 10 MG tablet [id number] No TAKE 1 TABLET BY MOUTH EVERY DAY Philip Aspen, Limmie Patricia, MD Taking Active   buPROPion (WELLBUTRIN XL) 150 MG 24 hr tablet [id number] No TAKE 1 TABLET BY MOUTH EVERY DAY Philip Aspen, Limmie Patricia, MD Taking Active   Cholecalciferol (VITAMIN D3 PO) [id number] No Take 1 tablet by mouth daily. [provider] Taking Active   fluticasone (FLOVENT HFA) 44 MCG/ACT inhaler [id number] No Inhale 1 puff into the lungs 2 (two) times daily.  Patient taking differently: Inhale 1 puff into the lungs 2 (two) times daily. As needed   Roderick Pee, MD Taking Active   ibuprofen (ADVIL) 200 MG tablet [id number] No Take 200 mg by mouth every 6 (six) hours as needed. [provider] Taking Active   scopolamine (TRANSDERM-SCOP) 1 MG/3DAYS [id number] No Place 1 patch (1.5 mg total) onto the skin every 3 (three) days. Ardyth Harps  Priscella Mann, MD Taking Active   sildenafil (VIAGRA) 100 MG tablet [id number] No Take 0.5-1 tablets (50-100 mg total) by mouth daily as needed for erectile dysfunction. Philip Aspen, Limmie Patricia, MD Taking Active   varenicline (CHANTIX) 1 MG tablet [id number] No TAKE 1 TABLET BY MOUTH TWICE A DAY Philip Aspen, Limmie Patricia, MD Taking Active               Assessment/Plan:   Tobacco Abuse - Currently uncontrolled -Continue varenicline 1mg  BID. Counseled that the mild increased irritability  could be from nicotine withdrawal effects as well. Patient will start NRT in form of gum or lozenge. Recommend 4mg  dose and patient will call with any questions once he picks up. -Set goal of being of being down to 10 cigarettes or less in 1 week, Quit date still at 03/09/23   Follow Up Plan: 1 week  Sherrill Raring, PharmD Clinical Pharmacist [phone]

## 2023-02-12 ENCOUNTER — Other Ambulatory Visit: Payer: 59

## 2023-02-12 DIAGNOSIS — Z720 Tobacco use: Secondary | ICD-10-CM

## 2023-02-12 NOTE — Progress Notes (Signed)
   02/12/2023 Name: Marc Brown MRN: [id number] DOB: [birth year]  Chief Complaint  Patient presents with   Medication Management    Smoking Cessation    Marc Brown is a 63 y.o. year old male who presented for a telephone visit.   They were referred to the pharmacist by their PCP for assistance in managing  smoking cessation .    Subjective:  Care Team: Primary Care Provider: Philip Aspen, Limmie Patricia, MD   Medication Access/Adherence  [facility]:  [phone] [place], [place] - [address] [address] Phone: [phone] Fax: [phone]   Patient reports affordability concerns with their medications: No  Patient reports access/transportation concerns to their pharmacy: No  Patient reports adherence concerns with their medications:  No     Tobacco Abuse:  Tobacco Use History: Has reached goal previously set of 10 cigarettes per day in the last 2 days  Current Medications: Chantix 1mg  BID, Wellbutrin XL 150mg  once daily, Purchased nicotine 4mg  gum but has not had to use yet  -Did experience some nausea with the morning dose but this is avoided with food on his stomach -Notes some increased mild irritability -Has experienced vivid dreams but reports they are manageable at this time  -Patient states that he would like to go "cold [place]" at this time and has removed all cigarettes from his possession.    Objective:  Lab Results  Component Value Date   HGBA1C 6.1 02/07/2022    Lab Results  Component Value Date   CREATININE 0.97 02/07/2022   BUN 16 02/07/2022   NA 136 02/07/2022   K 4.5 02/07/2022   CL 104 02/07/2022   CO2 22 02/07/2022    Lab Results  Component Value Date   CHOL 118 02/07/2022   HDL 35.10 (L) 02/07/2022   LDLCALC 65 02/07/2022   TRIG 86.0 02/07/2022   CHOLHDL 3 02/07/2022    Medications Reviewed Today   Medications were not reviewed in this encounter       Assessment/Plan:    Tobacco Abuse - Currently uncontrolled -Continue varenicline 1mg  BID and wellbutrin. -Counseled on trying to avoid triggers that make him want to reach for a cigarette. -Counseled that going cold [place] can definitely increase feelings of stress and irritability and discussed coping mechanisms. -Counseled on trying to use regular gum first for any craving and if this does not help, then use the nicotine gum as instructed. -Quit date still at 03/09/23   Follow Up Plan: 1 week  Sherrill Raring, PharmD Clinical Pharmacist [phone]

## 2023-02-19 ENCOUNTER — Other Ambulatory Visit: Payer: 59

## 2023-02-19 DIAGNOSIS — Z720 Tobacco use: Secondary | ICD-10-CM

## 2023-02-19 NOTE — Progress Notes (Signed)
   02/19/2023 Name: Marc Brown MRN: [id number] DOB: Jun 19, 1960  Chief Complaint  Patient presents with   Medication Management    Smoking Cessation    Marc Brown is a 63 y.o. year old male who presented for a telephone visit.   They were referred to the pharmacist by their PCP for assistance in managing  smoking cessation .    Subjective:  Care Team: Primary Care Provider: Philip Aspen, Limmie Patricia, MD   Medication Access/Adherence  [facility]acy:  [facility] [address] [place], [place] - [address] [place] [address] Phone: [phone] Fax: [phone]   Patient reports affordability concerns with their medications: No  Patient reports access/transportation concerns to their pharmacy: No  Patient reports adherence concerns with their medications:  No     Tobacco Abuse:  Tobacco Use History: Has not smoked a cigarette in 7 days (last cigarette 02/11/23).  Current Medications: Chantix 1mg  BID, Wellbutrin XL 150mg  once daily, Purchased nicotine 4mg  gum but has not had to use yet  -Did experience some nausea with the morning dose but this is avoided with food on his stomach -Notes some increased mild irritability -Has experienced vivid dreams but reports they are manageable at this time    Objective:  Lab Results  Component Value Date   HGBA1C 6.1 02/07/2022    Lab Results  Component Value Date   CREATININE 0.97 02/07/2022   BUN 16 02/07/2022   NA 136 02/07/2022   K 4.5 02/07/2022   CL 104 02/07/2022   CO2 22 02/07/2022    Lab Results  Component Value Date   CHOL 118 02/07/2022   HDL 35.10 (L) 02/07/2022   LDLCALC 65 02/07/2022   TRIG 86.0 02/07/2022   CHOLHDL 3 02/07/2022    Medications Reviewed Today   Medications were not reviewed in this encounter       Assessment/Plan:   Tobacco Abuse - Currently controlled -Continue varenicline 1mg  BID and wellbutrin. -Counseled on trying to avoid triggers that  make him want to reach for a cigarette. -Counseled that going cold [place] can definitely increase feelings of stress and irritability and discussed coping mechanisms. -Counseled on trying to use regular gum first for any craving and if this does not help, then use the nicotine gum as instructed. -Initial quit date was 03/09/23, patient has not smoked in 7 days   Follow Up Plan: 1 month  Sherrill Raring, PharmD Clinical Pharmacist [phone]

## 2023-02-28 ENCOUNTER — Encounter: Payer: Self-pay | Admitting: *Deleted

## 2023-03-10 ENCOUNTER — Other Ambulatory Visit: Payer: Self-pay | Admitting: Internal Medicine

## 2023-03-10 DIAGNOSIS — E782 Mixed hyperlipidemia: Secondary | ICD-10-CM

## 2023-03-19 ENCOUNTER — Other Ambulatory Visit: Payer: 59

## 2023-03-19 DIAGNOSIS — Z720 Tobacco use: Secondary | ICD-10-CM

## 2023-03-19 NOTE — Progress Notes (Signed)
 03/19/2023 Name: Marc Brown MRN: [id number] DOB: 03-Oct-1960  Chief Complaint  Patient presents with   Medication Management    Smoking cessation    Marc Brown is a 63 y.o. year old male who presented for a telephone visit.   They were referred to the pharmacist by their PCP for assistance in managing  smoking cessation .    Subjective:  Care Team: Primary Care Provider: Philip Aspen, Limmie Patricia, MD   Medication Access/Adherence  [facility]  [facility] [address] [place], [place] - [address] [place] [address] Phone: [phone] Fax: [phone]   Patient reports affordability concerns with their medications: No  Patient reports access/transportation concerns to their pharmacy: No  Patient reports adherence concerns with their medications:  No     Tobacco Abuse:  Tobacco Use History: Has not smoked a cigarette in over 30 days (last cigarette 02/11/23).  Current Medications: Chantix 1mg  BID, Wellbutrin XL 150mg  once daily, Purchased nicotine 4mg  gum and used just a few pieces while traveling  -Did experience some nausea with the morning dose of chantix but this is avoided with food on his stomach -Has experienced vivid dreams but reports they are manageable at this time    Objective:  Lab Results  Component Value Date   HGBA1C 6.1 02/07/2022    Lab Results  Component Value Date   CREATININE 0.97 02/07/2022   BUN 16 02/07/2022   NA 136 02/07/2022   K 4.5 02/07/2022   CL 104 02/07/2022   CO2 22 02/07/2022    Lab Results  Component Value Date   CHOL 118 02/07/2022   HDL 35.10 (L) 02/07/2022   LDLCALC 65 02/07/2022   TRIG 86.0 02/07/2022   CHOLHDL 3 02/07/2022    Medications Reviewed Today     Reviewed by Sherrill Raring, RPH (Pharmacist) on 03/19/23 at [phone]  Med List Status: <None>   Medication Order Taking? Sig Documenting Provider Last Dose Status Informant  albuterol (VENTOLIN HFA) 108 (90 Base)  MCG/ACT inhaler [id number] No Inhale 2 puffs into the lungs every 6 (six) hours as needed for wheezing or shortness of breath. Philip Aspen, Limmie Patricia, MD Taking Active   amitriptyline (ELAVIL) 50 MG tablet [id number]  TAKE 1 TABLET BY MOUTH EVERYDAY AT BEDTIME Philip Aspen, Limmie Patricia, MD  Active   aspirin 81 MG tablet [id number] No Take 81 mg by mouth at bedtime. [provider] Taking Active Self  atorvastatin (LIPITOR) 10 MG tablet [id number]  TAKE 1 TABLET BY MOUTH EVERY DAY Philip Aspen, Limmie Patricia, MD  Active   buPROPion (WELLBUTRIN XL) 150 MG 24 hr tablet [id number] No TAKE 1 TABLET BY MOUTH EVERY DAY Philip Aspen, Limmie Patricia, MD Taking Active   Cholecalciferol (VITAMIN D3 PO) [id number] No Take 1 tablet by mouth daily. [provider] Taking Active   fluticasone (FLOVENT HFA) 44 MCG/ACT inhaler [id number] No Inhale 1 puff into the lungs 2 (two) times daily.  Patient taking differently: Inhale 1 puff into the lungs 2 (two) times daily. As needed   Roderick Pee, MD Taking Active   ibuprofen (ADVIL) 200 MG tablet [id number] No Take 200 mg by mouth every 6 (six) hours as needed. [provider] Taking Active   scopolamine (TRANSDERM-SCOP) 1 MG/3DAYS [id number] No Place 1 patch (1.5 mg total) onto the skin every 3 (three) days. Philip Aspen, Limmie Patricia, MD Taking Active   sildenafil (VIAGRA) 100 MG tablet [id number] No Take 0.5-1 tablets (  50-100 mg total) by mouth daily as needed for erectile dysfunction. Philip Aspen, Limmie Patricia, MD Taking Active   varenicline (CHANTIX) 1 MG tablet [id number] No TAKE 1 TABLET BY MOUTH TWICE A DAY Philip Aspen, Limmie Patricia, MD Taking Active               Assessment/Plan:   Tobacco Abuse - Currently controlled -Continue varenicline 1mg  BID and wellbutrin. -Counseled on trying to avoid triggers that make him want to reach for a cigarette. -Counseled on trying to use regular gum first for any craving and if this does not  help, then use the nicotine gum as instructed. -Initial quit date was 03/09/23, patient has not smoked in over a month -Recommend staying on chantix through end of year, as studies suggest 1 year of treatment can improve success rate and long term cessation.   Follow Up Plan: 6 weeks  Sherrill Raring, PharmD Clinical Pharmacist [phone]

## 2023-05-06 ENCOUNTER — Other Ambulatory Visit

## 2023-05-06 DIAGNOSIS — F17210 Nicotine dependence, cigarettes, uncomplicated: Secondary | ICD-10-CM

## 2023-05-06 NOTE — Progress Notes (Signed)
 05/06/2023 Name: Marc Brown MRN: [id number] DOB: [birth year]  Chief Complaint  Patient presents with   Medication Management    Smoking Cessation    BUD ZILINSKI is a 63 y.o. year old male who presented for a telephone visit.   They were referred to the pharmacist by their PCP for assistance in managing  smoking cessation .    Subjective:  Care Team: Primary Care Provider: Zilphia Hilt, Charyl Coppersmith, MD   Medication Access/Adherence  [facility]  [facility] [address] [place], [place] - [address] [place] [address] Phone: [phone] Fax: [phone]   Patient reports affordability concerns with their medications: No  Patient reports access/transportation concerns to their pharmacy: No  Patient reports adherence concerns with their medications:  No     Tobacco Abuse:  Tobacco Use History: Has not smoked a cigarette in over 30 days (last cigarette 02/11/23).  Current Medications: Chantix  1mg  BID, Wellbutrin  XL 150mg  once daily, Purchased nicotine 4mg  gum and used just a few pieces while traveling  -Did experience some nausea with the morning dose of chantix  but this is avoided with food on his stomach -Has experienced vivid dreams but reports they are manageable at this time    Objective:  Lab Results  Component Value Date   HGBA1C 6.1 02/07/2022    Lab Results  Component Value Date   CREATININE 0.97 02/07/2022   BUN 16 02/07/2022   NA 136 02/07/2022   K 4.5 02/07/2022   CL 104 02/07/2022   CO2 22 02/07/2022    Lab Results  Component Value Date   CHOL 118 02/07/2022   HDL 35.10 (L) 02/07/2022   LDLCALC 65 02/07/2022   TRIG 86.0 02/07/2022   CHOLHDL 3 02/07/2022    Medications Reviewed Today     Reviewed by Carnell Christian, RPH (Pharmacist) on 05/06/23 at [phone]  Med List Status: <None>   Medication Order Taking? Sig Documenting Provider Last Dose Status Informant  albuterol  (VENTOLIN  HFA) 108 (90 Base)  MCG/ACT inhaler [id number]  Inhale 2 puffs into the lungs every 6 (six) hours as needed for wheezing or shortness of breath. Zilphia Hilt, Charyl Coppersmith, MD  Active   amitriptyline  (ELAVIL ) 50 MG tablet [id number] Yes TAKE 1 TABLET BY MOUTH EVERYDAY AT BEDTIME Zilphia Hilt, Charyl Coppersmith, MD Taking Active   aspirin 81 MG tablet [id number] Yes Take 81 mg by mouth at bedtime. [provider] Taking Active Self  atorvastatin  (LIPITOR) 10 MG tablet [id number] Yes TAKE 1 TABLET BY MOUTH EVERY DAY Zilphia Hilt, Charyl Coppersmith, MD Taking Active   buPROPion  (WELLBUTRIN  XL) 150 MG 24 hr tablet [id number] Yes TAKE 1 TABLET BY MOUTH EVERY DAY Zilphia Hilt, Charyl Coppersmith, MD Taking Active   Cholecalciferol (VITAMIN D3 PO) [id number] Yes Take 1 tablet by mouth daily. [provider] Taking Active   fluticasone  (FLOVENT  HFA) 44 MCG/ACT inhaler [id number]  Inhale 1 puff into the lungs 2 (two) times daily.  Patient taking differently: Inhale 1 puff into the lungs 2 (two) times daily. As needed   Marybelle Smiling, MD  Active   ibuprofen (ADVIL) 200 MG tablet [id number]  Take 200 mg by mouth every 6 (six) hours as needed. [provider]  Active   scopolamine  (TRANSDERM-SCOP) 1 MG/3DAYS [id number]  Place 1 patch (1.5 mg total) onto the skin every 3 (three) days. Zilphia Hilt, Charyl Coppersmith, MD  Active   sildenafil  (VIAGRA ) 100 MG tablet [id number]  Take 0.5-1 tablets (  50-100 mg total) by mouth daily as needed for erectile dysfunction. Zilphia Hilt, Charyl Coppersmith, MD  Active   varenicline  (CHANTIX ) 1 MG tablet [id number] Yes TAKE 1 TABLET BY MOUTH TWICE A DAY Zilphia Hilt, Charyl Coppersmith, MD Taking Active               Assessment/Plan:   Tobacco Abuse - Currently controlled -Continue varenicline  1mg  BID and wellbutrin . -Counseled on trying to avoid triggers that make him want to reach for a cigarette. -Counseled on trying to use regular gum first for any craving and if this does not help, then use the  nicotine gum as instructed. -Initial quit date was 03/09/23, patient has not smoked since prior to 03/09/23. -Recommend staying on chantix  through end of year, as studies suggest 1 year of treatment can improve success rate and long term cessation.   Follow Up Plan: Patient will reach out if needed  Carnell Christian, PharmD Clinical Pharmacist [phone]

## 2023-06-06 ENCOUNTER — Encounter: Payer: Self-pay | Admitting: Internal Medicine

## 2023-06-06 DIAGNOSIS — Z720 Tobacco use: Secondary | ICD-10-CM

## 2023-06-06 DIAGNOSIS — R6884 Jaw pain: Secondary | ICD-10-CM

## 2023-06-06 DIAGNOSIS — E782 Mixed hyperlipidemia: Secondary | ICD-10-CM

## 2023-06-06 MED ORDER — AMITRIPTYLINE HCL 50 MG PO TABS: 50.0000 mg | ORAL_TABLET | Freq: Every day | ORAL | 0 refills | Status: DC

## 2023-06-06 MED ORDER — BUPROPION HCL ER (XL) 150 MG PO TB24: 150.0000 mg | ORAL_TABLET | Freq: Every day | ORAL | 1 refills | Status: DC

## 2023-06-06 MED ORDER — ATORVASTATIN CALCIUM 10 MG PO TABS: 10.0000 mg | ORAL_TABLET | Freq: Every day | ORAL | 0 refills | Status: DC

## 2023-06-27 ENCOUNTER — Ambulatory Visit: Admitting: Adult Health

## 2023-06-27 ENCOUNTER — Telehealth (INDEPENDENT_AMBULATORY_CARE_PROVIDER_SITE_OTHER): Admitting: Adult Health

## 2023-06-27 VITALS — Wt 280.0 lb

## 2023-06-27 DIAGNOSIS — J069 Acute upper respiratory infection, unspecified: Secondary | ICD-10-CM | POA: Diagnosis not present

## 2023-06-27 MED ORDER — AMOXICILLIN-POT CLAVULANATE 875-125 MG PO TABS: 1.0000 | ORAL_TABLET | Freq: Two times a day (BID) | ORAL | 0 refills | Status: AC

## 2023-06-27 MED ORDER — PREDNISONE 20 MG PO TABS: 20.0000 mg | ORAL_TABLET | Freq: Every day | ORAL | 0 refills | Status: DC

## 2023-06-27 MED ORDER — BENZONATATE 200 MG PO CAPS: 200.0000 mg | ORAL_CAPSULE | Freq: Three times a day (TID) | ORAL | 0 refills | Status: DC | PRN

## 2023-06-27 NOTE — Progress Notes (Signed)
 Virtual Visit via Video Note  I connected with Marc Brown  on 06/27/23 at  3:00 PM EDT by a video enabled telemedicine application and verified that I am speaking with the correct person using two identifiers.  Location patient: home Location provider:work or home office Persons participating in the virtual visit: patient, provider  I discussed the limitations of evaluation and management by telemedicine and the availability of in person appointments. The patient expressed understanding and agreed to proceed.   HPI:  He is being evaluated today for an acute issue.  Reports that his symptoms started about 10 days ago while on a cruise through [place].  Symptoms include that of nasal congestion, semiproductive cough, and wheezing.  He went to the infirmary on the cruise ship and was prescribed a Z-Pak which he reports that helped improve his symptoms but did not get rid of them completely.  He has been using over-the-counter cold and cough medication without much improvement.  He has not had any fevers or chills but is unsure due to being on over-the-counter cold and cough medication.  He reports that his partner had the same symptoms and was treated with a Z pack as well and is feeling better.   ROS: See pertinent positives and negatives per HPI.  Past Medical History:  Diagnosis Date   ED (erectile dysfunction)    GERD (gastroesophageal reflux disease)    rare   Hx of adenomatous colonic polyps    Hyperlipidemia    denies   Obese    Recurrent boils    Tobacco abuse     Past Surgical History:  Procedure Laterality Date   CHOLECYSTECTOMY N/A 02/14/2013   Procedure: LAPAROSCOPIC CHOLECYSTECTOMY;  Surgeon: Elon CHRISTELLA Pacini, MD;  Location: [facility] OR;  Service: General;  Laterality: N/A;   COLONOSCOPY     ELBOW FRACTURE SURGERY Left    left   WISDOM TOOTH EXTRACTION      Family History  Problem Relation Age of Onset   Kidney disease Mother    Hearing loss Mother        due  to whooping cough   Heart disease Father    Obesity Father    Schizophrenia Father    Cancer - Other Sister        cancer of duodeum   Multiple sclerosis Brother    Hyperlipidemia Brother    Colon cancer Neg Hx    Esophageal cancer Neg Hx    Rectal cancer Neg Hx    Stomach cancer Neg Hx    Colon polyps Neg Hx        Current Outpatient Medications:    amoxicillin-clavulanate (AUGMENTIN) 875-125 MG tablet, Take 1 tablet by mouth 2 (two) times daily., Disp: 20 tablet, Rfl: 0   benzonatate  (TESSALON ) 200 MG capsule, Take 1 capsule (200 mg total) by mouth 3 (three) times daily as needed., Disp: 30 capsule, Rfl: 0   predniSONE  (DELTASONE ) 20 MG tablet, Take 1 tablet (20 mg total) by mouth daily with breakfast., Disp: 7 tablet, Rfl: 0   albuterol  (VENTOLIN  HFA) 108 (90 Base) MCG/ACT inhaler, Inhale 2 puffs into the lungs every 6 (six) hours as needed for wheezing or shortness of breath., Disp: 8 g, Rfl: 2   amitriptyline  (ELAVIL ) 50 MG tablet, Take 1 tablet (50 mg total) by mouth at bedtime., Disp: 90 tablet, Rfl: 0   aspirin 81 MG tablet, Take 81 mg by mouth at bedtime., Disp: , Rfl:    atorvastatin  (LIPITOR) 10 MG  tablet, Take 1 tablet (10 mg total) by mouth daily., Disp: 90 tablet, Rfl: 0   buPROPion  (WELLBUTRIN  XL) 150 MG 24 hr tablet, Take 1 tablet (150 mg total) by mouth daily., Disp: 90 tablet, Rfl: 1   Cholecalciferol (VITAMIN D3 PO), Take 1 tablet by mouth daily., Disp: , Rfl:    fluticasone  (FLOVENT  HFA) 44 MCG/ACT inhaler, Inhale 1 puff into the lungs 2 (two) times daily. (Patient taking differently: Inhale 1 puff into the lungs 2 (two) times daily. As needed), Disp: 2 Inhaler, Rfl: 6   ibuprofen (ADVIL) 200 MG tablet, Take 200 mg by mouth every 6 (six) hours as needed., Disp: , Rfl:    scopolamine  (TRANSDERM-SCOP) 1 MG/3DAYS, Place 1 patch (1.5 mg total) onto the skin every 3 (three) days., Disp: 10 patch, Rfl: 12   sildenafil  (VIAGRA ) 100 MG tablet, Take 0.5-1 tablets (50-100 mg  total) by mouth daily as needed for erectile dysfunction., Disp: 5 tablet, Rfl: 11   varenicline  (CHANTIX ) 1 MG tablet, TAKE 1 TABLET BY MOUTH TWICE A DAY, Disp: 168 tablet, Rfl: 2  EXAM:  VITALS per patient if applicable:  GENERAL: alert, oriented, appears well and in no acute distress  HEENT: atraumatic, conjunttiva clear, no obvious abnormalities on inspection of external nose and ears  NECK: normal movements of the head and neck  LUNGS: on inspection no signs of respiratory distress, breathing rate appears normal, no obvious gross SOB, gasping or wheezing  CV: no obvious cyanosis  MS: moves all visible extremities without noticeable abnormality  PSYCH/NEURO: pleasant and cooperative, no obvious depression or anxiety, speech and thought processing grossly intact  ASSESSMENT AND PLAN:  Discussed the following assessment and plan:  1. Upper respiratory tract infection, unspecified type (Primary) - Will treat due to symptoms and duration. Follow up if not resolved by the end of abx therapy.  - amoxicillin-clavulanate (AUGMENTIN) 875-125 MG tablet; Take 1 tablet by mouth 2 (two) times daily.  Dispense: 20 tablet; Refill: 0 - predniSONE  (DELTASONE ) 20 MG tablet; Take 1 tablet (20 mg total) by mouth daily with breakfast.  Dispense: 7 tablet; Refill: 0 - benzonatate  (TESSALON ) 200 MG capsule; Take 1 capsule (200 mg total) by mouth 3 (three) times daily as needed.  Dispense: 30 capsule; Refill: 0   Darleene Shape, NP      I discussed the assessment and treatment plan with the patient. The patient was provided an opportunity to ask questions and all were answered. The patient agreed with the plan and demonstrated an understanding of the instructions.   The patient was advised to call back or seek an in-person evaluation if the symptoms worsen or if the condition fails to improve as anticipated.   Deaunte Dente, NP

## 2023-07-02 MED ORDER — BUPROPION HCL ER (XL) 150 MG PO TB24: 150.0000 mg | ORAL_TABLET | Freq: Every day | ORAL | 1 refills | Status: DC

## 2023-07-02 NOTE — Addendum Note (Signed)
 Addended by: KATHRYNE MILLMAN B on: 07/02/2023 09:37 AM   Modules accepted: Orders

## 2023-08-15 ENCOUNTER — Telehealth: Payer: Self-pay

## 2023-08-15 DIAGNOSIS — R7302 Impaired glucose tolerance (oral): Secondary | ICD-10-CM

## 2023-08-15 DIAGNOSIS — Z122 Encounter for screening for malignant neoplasm of respiratory organs: Secondary | ICD-10-CM

## 2023-08-15 DIAGNOSIS — Z87891 Personal history of nicotine dependence: Secondary | ICD-10-CM

## 2023-08-15 DIAGNOSIS — E782 Mixed hyperlipidemia: Secondary | ICD-10-CM

## 2023-08-15 DIAGNOSIS — R5383 Other fatigue: Secondary | ICD-10-CM

## 2023-08-15 DIAGNOSIS — Z0000 Encounter for general adult medical examination without abnormal findings: Secondary | ICD-10-CM

## 2023-08-15 DIAGNOSIS — E559 Vitamin D deficiency, unspecified: Secondary | ICD-10-CM

## 2023-08-15 DIAGNOSIS — N401 Enlarged prostate with lower urinary tract symptoms: Secondary | ICD-10-CM

## 2023-08-15 NOTE — Telephone Encounter (Signed)
 Lung Cancer Screening Narrative/Criteria Questionnaire (Cigarette Smokers Only- No Cigars/Pipes/vapes)   Marc Brown [name]   SDMV:08/23/2023 at 1:00 pm with Josette          03/27/1960               LDCT: 08/30/2023 at 10:30 am at [facility]    63 y.o.  Phone: [phone]  Lung Screening Narrative (confirm age 32-77 yrs Medicare / 50-80 yrs Private pay insurance)   Insurance information: Community education officer   Referring Provider: Hernandez,MD   This screening involves an initial phone call with a team member from our program. It is called a shared decision making visit. The initial meeting is required by  insurance and Medicare to make sure you understand the program. This appointment takes about 15-20 minutes to complete. You will complete the screening scan at your scheduled date/time.  This scan takes about 5-10 minutes to complete. You can eat and drink normally before and after the scan.  Criteria questions for Lung Cancer Screening:   Are you a current or former smoker? Former Age began smoking: 44   If you are a former smoker, what year did you quit smoking? Quit 04/2023 N/A (within 15 yrs)   To calculate your smoking history, I need an accurate estimate of how many packs of cigarettes you smoked per day and for how many years. (Not just the number of PPD you are now smoking)   Years smoking 25 x Packs per day 1 = Pack years 25   (at least 20 pack yrs)   (Make sure they understand that we need to know how much they have smoked in the past, not just the number of PPD they are smoking now)  Do you have a personal history of cancer?  No    Do you have a family history of cancer? Yes  (cancer type and and relative) Mother had melanoma. Sister had duodenum cancer.   Are you coughing up blood?  No  Have you had unexplained weight loss of 15 lbs or more in the last 6 months? No  It looks like you meet all criteria.  When would be a good time for us  to schedule you for this screening?   Additional  information: N/A

## 2023-08-15 NOTE — Telephone Encounter (Signed)
 Copied from CRM [id number]. Topic: Clinical - Request for Lab/Test Order >> Aug 14, 2023  4:47 PM Rea C wrote: Reason for CRM: Patient is scheduled for a physical later this year and would like to have lab orders in to have completed prior to physical.

## 2023-08-19 NOTE — Addendum Note (Signed)
 Addended by: KATHRYNE MILLMAN B on: 08/19/2023 08:44 AM   Modules accepted: Orders

## 2023-08-19 NOTE — Telephone Encounter (Signed)
 Labs ordered.  Left detailed message on machine for patient to schedule a lab appointment.

## 2023-08-23 ENCOUNTER — Encounter: Payer: Self-pay | Admitting: *Deleted

## 2023-08-23 ENCOUNTER — Ambulatory Visit (HOSPITAL_BASED_OUTPATIENT_CLINIC_OR_DEPARTMENT_OTHER)

## 2023-08-23 ENCOUNTER — Ambulatory Visit

## 2023-08-23 DIAGNOSIS — Z87891 Personal history of nicotine dependence: Secondary | ICD-10-CM | POA: Diagnosis not present

## 2023-08-23 NOTE — Patient Instructions (Signed)

## 2023-08-23 NOTE — Progress Notes (Signed)
 Virtual Visit via Telephone Note  I connected with Marc Brown on 08/23/23 at  1:00 PM EDT by telephone and verified that I am speaking with the correct person using two identifiers.  Location: Patient: in home Provider: [address] [place], [place], [address]     Shared Decision Making Visit Lung Cancer Screening Program [phone])   Eligibility: Age 63 y.o. Pack Years Smoking History Calculation 29 (# packs/per year x # years smoked) Recent History of coughing up blood  no Unexplained weight loss? no ( >Than 15 pounds within the last 6 months ) Prior History Lung / other cancer no (Diagnosis within the last 5 years already requiring surveillance chest CT Scans). Smoking Status Former Smoker Former Smokers: Years since quit: < 1 year  Quit Date: 04-2023  Visit Components: Discussion included one or more decision making aids. yes Discussion included risk/benefits of screening. yes Discussion included potential follow up diagnostic testing for abnormal scans. yes Discussion included meaning and risk of over diagnosis. yes Discussion included meaning and risk of False Positives. yes Discussion included meaning of total radiation exposure. yes  Counseling Included: Importance of adherence to annual lung cancer LDCT screening. yes Impact of comorbidities on ability to participate in the program. yes Ability and willingness to under diagnostic treatment. yes  Smoking Cessation Counseling: Current Smokers:  Discussed importance of smoking cessation. yes Information about tobacco cessation classes and interventions provided to patient. yes Patient provided with ticket for LDCT Scan. yes Symptomatic Patient. no  Counseling NA Diagnosis Code: Tobacco Use Z72.0 Asymptomatic Patient yes  Counseling (Intermediate counseling: > three minutes counseling) H9563  Counseled patient 3-4 minutes regarding tobacco use.   Former Smokers:  Discussed the importance of  maintaining cigarette abstinence. yes Diagnosis Code: Personal History of Nicotine Dependence. S12.108 Information about tobacco cessation classes and interventions provided to patient. Yes Patient provided with ticket for LDCT Scan. yes Written Order for Lung Cancer Screening with LDCT placed in Epic. Yes (CT Chest Lung Cancer Screening Low Dose W/O CM) [id number] Z12.2-Screening of respiratory organs Z87.891-Personal history of nicotine dependence   Josette Ranger, RN 08/23/23

## 2023-08-30 ENCOUNTER — Ambulatory Visit (HOSPITAL_BASED_OUTPATIENT_CLINIC_OR_DEPARTMENT_OTHER)
Admission: RE | Admit: 2023-08-30 | Discharge: 2023-08-30 | Disposition: A | Discharge status: Home / Self Care | Source: Ambulatory Visit | Attending: Internal Medicine | Admitting: Internal Medicine

## 2023-08-30 DIAGNOSIS — Z87891 Personal history of nicotine dependence: Secondary | ICD-10-CM | POA: Diagnosis present

## 2023-08-30 DIAGNOSIS — Z122 Encounter for screening for malignant neoplasm of respiratory organs: Secondary | ICD-10-CM | POA: Insufficient documentation

## 2023-09-01 ENCOUNTER — Other Ambulatory Visit: Payer: Self-pay | Admitting: Internal Medicine

## 2023-09-01 DIAGNOSIS — E782 Mixed hyperlipidemia: Secondary | ICD-10-CM

## 2023-09-01 DIAGNOSIS — R6884 Jaw pain: Secondary | ICD-10-CM

## 2023-09-13 ENCOUNTER — Other Ambulatory Visit: Payer: Self-pay

## 2023-09-13 DIAGNOSIS — Z87891 Personal history of nicotine dependence: Secondary | ICD-10-CM

## 2023-09-13 DIAGNOSIS — Z122 Encounter for screening for malignant neoplasm of respiratory organs: Secondary | ICD-10-CM

## 2023-09-14 ENCOUNTER — Other Ambulatory Visit: Payer: Self-pay | Admitting: Internal Medicine

## 2023-09-14 DIAGNOSIS — F17210 Nicotine dependence, cigarettes, uncomplicated: Secondary | ICD-10-CM

## 2023-10-11 ENCOUNTER — Other Ambulatory Visit (HOSPITAL_BASED_OUTPATIENT_CLINIC_OR_DEPARTMENT_OTHER): Payer: Self-pay

## 2023-10-11 MED ORDER — COMIRNATY 30 MCG/0.3ML IM SUSY
0.3000 mL | PREFILLED_SYRINGE | Freq: Once | INTRAMUSCULAR | 0 refills | Status: AC
  Filled 2023-10-11: qty 0.3, 1d supply, fill #0

## 2023-10-11 MED ORDER — FLUZONE 0.5 ML IM SUSY
0.5000 mL | PREFILLED_SYRINGE | Freq: Once | INTRAMUSCULAR | 0 refills | Status: AC
  Filled 2023-10-11: qty 0.5, 1d supply, fill #0

## 2023-10-22 ENCOUNTER — Other Ambulatory Visit

## 2023-10-22 ENCOUNTER — Ambulatory Visit: Payer: Self-pay | Admitting: Internal Medicine

## 2023-10-22 DIAGNOSIS — E559 Vitamin D deficiency, unspecified: Secondary | ICD-10-CM

## 2023-10-22 DIAGNOSIS — R5383 Other fatigue: Secondary | ICD-10-CM | POA: Diagnosis not present

## 2023-10-22 DIAGNOSIS — E1169 Type 2 diabetes mellitus with other specified complication: Secondary | ICD-10-CM

## 2023-10-22 DIAGNOSIS — E782 Mixed hyperlipidemia: Secondary | ICD-10-CM | POA: Diagnosis not present

## 2023-10-22 DIAGNOSIS — N401 Enlarged prostate with lower urinary tract symptoms: Secondary | ICD-10-CM

## 2023-10-22 DIAGNOSIS — R351 Nocturia: Secondary | ICD-10-CM | POA: Diagnosis not present

## 2023-10-22 DIAGNOSIS — Z0000 Encounter for general adult medical examination without abnormal findings: Secondary | ICD-10-CM

## 2023-10-22 DIAGNOSIS — R7302 Impaired glucose tolerance (oral): Secondary | ICD-10-CM | POA: Diagnosis not present

## 2023-10-22 LAB — COMPREHENSIVE METABOLIC PANEL WITH GFR
ALT: 40 U/L (ref 0–53)
AST: 23 U/L (ref 0–37)
Albumin: 4.2 g/dL (ref 3.5–5.2)
Alkaline Phosphatase: 66 U/L (ref 39–117)
BUN: 15 mg/dL (ref 6–23)
CO2: 24 meq/L (ref 19–32)
Calcium: 8.8 mg/dL (ref 8.4–10.5)
Chloride: 104 meq/L (ref 96–112)
Creatinine, Ser: 0.91 mg/dL (ref 0.40–1.50)
GFR: 89.8 mL/min (ref >60.00)
Glucose, Bld: 126 mg/dL — ABNORMAL HIGH (ref 70–99)
Potassium: 4.2 meq/L (ref 3.5–5.1)
Sodium: 137 meq/L (ref 135–145)
Total Bilirubin: 0.4 mg/dL (ref 0.2–1.2)
Total Protein: 6.5 g/dL (ref 6.0–8.3)

## 2023-10-22 LAB — HEMOGLOBIN A1C: Hgb A1c MFr Bld: 6.5 % (ref 4.6–6.5)

## 2023-10-22 LAB — CBC WITH DIFFERENTIAL/PLATELET
Basophils Absolute: 0 K/uL (ref 0.0–0.1)
Basophils Relative: 0.7 % (ref 0.0–3.0)
Eosinophils Absolute: 0.1 K/uL (ref 0.0–0.7)
Eosinophils Relative: 2.3 % (ref 0.0–5.0)
HCT: 44.4 % (ref 39.0–52.0)
Hemoglobin: 15.1 g/dL (ref 13.0–17.0)
Lymphocytes Relative: 41.5 % (ref 12.0–46.0)
Lymphs Abs: 2.4 K/uL (ref 0.7–4.0)
MCHC: 34 g/dL (ref 30.0–36.0)
MCV: 86.2 fl (ref 78.0–100.0)
Monocytes Absolute: 0.6 K/uL (ref 0.1–1.0)
Monocytes Relative: 9.4 % (ref 3.0–12.0)
Neutro Abs: 2.7 K/uL (ref 1.4–7.7)
Neutrophils Relative %: 46.1 % (ref 43.0–77.0)
Platelets: 273 K/uL (ref 150.0–400.0)
RBC: 5.15 Mil/uL (ref 4.22–5.81)
RDW: 13.7 % (ref 11.5–15.5)
WBC: 5.9 K/uL (ref 4.0–10.5)

## 2023-10-22 LAB — VITAMIN D 25 HYDROXY (VIT D DEFICIENCY, FRACTURES): VITD: 34.49 ng/mL (ref 30.00–100.00)

## 2023-10-22 LAB — TSH: TSH: 2.41 u[IU]/mL (ref 0.35–5.50)

## 2023-10-22 LAB — LIPID PANEL
Cholesterol: 137 mg/dL (ref 0–200)
HDL: 39.1 mg/dL (ref >39.00)
LDL Cholesterol: 75 mg/dL (ref 0–99)
NonHDL: 97.83
Total CHOL/HDL Ratio: 4
Triglycerides: 114 mg/dL (ref 0.0–149.0)
VLDL: 22.8 mg/dL (ref 0.0–40.0)

## 2023-10-22 LAB — PSA: PSA: 1.06 ng/mL (ref 0.10–4.00)

## 2023-10-22 LAB — VITAMIN B12: Vitamin B-12: 236 pg/mL (ref 211–911)

## 2023-10-22 MED ORDER — METFORMIN HCL 500 MG PO TABS: 500.0000 mg | ORAL_TABLET | Freq: Two times a day (BID) | ORAL | 1 refills | Status: DC

## 2023-11-30 ENCOUNTER — Other Ambulatory Visit: Payer: Self-pay | Admitting: Internal Medicine

## 2023-11-30 DIAGNOSIS — R6884 Jaw pain: Secondary | ICD-10-CM

## 2023-11-30 DIAGNOSIS — E782 Mixed hyperlipidemia: Secondary | ICD-10-CM

## 2023-12-09 ENCOUNTER — Other Ambulatory Visit: Payer: Self-pay | Admitting: Internal Medicine

## 2023-12-09 ENCOUNTER — Ambulatory Visit: Admitting: Internal Medicine

## 2023-12-09 ENCOUNTER — Encounter: Payer: Self-pay | Admitting: Internal Medicine

## 2023-12-09 VITALS — BP 130/80 | HR 85 | Temp 98.1°F | Ht 72.5 in | Wt 308.8 lb

## 2023-12-09 DIAGNOSIS — N401 Enlarged prostate with lower urinary tract symptoms: Secondary | ICD-10-CM

## 2023-12-09 DIAGNOSIS — F459 Somatoform disorder, unspecified: Secondary | ICD-10-CM

## 2023-12-09 DIAGNOSIS — E559 Vitamin D deficiency, unspecified: Secondary | ICD-10-CM

## 2023-12-09 DIAGNOSIS — E1169 Type 2 diabetes mellitus with other specified complication: Secondary | ICD-10-CM

## 2023-12-09 DIAGNOSIS — R7302 Impaired glucose tolerance (oral): Secondary | ICD-10-CM

## 2023-12-09 DIAGNOSIS — Z720 Tobacco use: Secondary | ICD-10-CM

## 2023-12-09 DIAGNOSIS — Z0000 Encounter for general adult medical examination without abnormal findings: Secondary | ICD-10-CM

## 2023-12-09 MED ORDER — SILDENAFIL CITRATE 100 MG PO TABS: 50.0000 mg | ORAL_TABLET | Freq: Every day | ORAL | 11 refills | Status: DC | PRN

## 2023-12-09 MED ORDER — BUPROPION HCL ER (XL) 150 MG PO TB24: 150.0000 mg | ORAL_TABLET | Freq: Every day | ORAL | 1 refills | Status: AC

## 2023-12-09 MED ORDER — TIRZEPATIDE 2.5 MG/0.5ML ~~LOC~~ SOAJ: 2.5000 mg | SUBCUTANEOUS | 0 refills | Status: DC

## 2023-12-09 MED ORDER — METFORMIN HCL 500 MG PO TABS: 500.0000 mg | ORAL_TABLET | Freq: Two times a day (BID) | ORAL | 1 refills | Status: AC

## 2023-12-09 NOTE — Telephone Encounter (Signed)
 Okay to switch to Levitra?

## 2023-12-09 NOTE — Telephone Encounter (Signed)
 Viagra  to   vardenafil (LEVITRA) 20 MG tablet

## 2023-12-09 NOTE — Progress Notes (Signed)
 Established Patient Office Visit     CC/Reason for Visit: Annual preventive exam  HPI: Marc Brown is a 63 y.o. male who is coming in today for the above mentioned reasons. Past Medical History is significant for: Newly diagnosed diabetes, hyperlipidemia, obesity, vitamin D  deficiency.  He has quit smoking as of April.  Still taking Chantix .  Is feeling well.  Has gained significant amount of weight with his smoking cessation.  Has routine eye and dental care.  Immunizations are up-to-date.  Cancer screening is up-to-date.   Past Medical/Surgical History: Past Medical History:  Diagnosis Date   ED (erectile dysfunction)    GERD (gastroesophageal reflux disease)    rare   Hx of adenomatous colonic polyps    Hyperlipidemia    denies   Obese    Recurrent boils    Tobacco abuse     Past Surgical History:  Procedure Laterality Date   CHOLECYSTECTOMY N/A 02/14/2013   Procedure: LAPAROSCOPIC CHOLECYSTECTOMY;  Surgeon: Elon CHRISTELLA Pacini, MD;  Location: [facility] OR;  Service: General;  Laterality: N/A;   COLONOSCOPY     ELBOW FRACTURE SURGERY Left    left   WISDOM TOOTH EXTRACTION      Social History:  reports that he has quit smoking. His smoking use included cigarettes. He has a 20 pack-year smoking history. He has never used smokeless tobacco. He reports current alcohol use of about 8.0 standard drinks of alcohol per week. He reports that he does not use drugs.  Allergies: No Known Allergies  Family History:  Family History  Problem Relation Age of Onset   Kidney disease Mother    Hearing loss Mother        due to whooping cough   Heart disease Father    Obesity Father    Schizophrenia Father    Cancer - Other Sister        cancer of duodeum   Multiple sclerosis Brother    Hyperlipidemia Brother    Colon cancer Neg Hx    Esophageal cancer Neg Hx    Rectal cancer Neg Hx    Stomach cancer Neg Hx    Colon polyps Neg Hx      Current Outpatient Medications:     albuterol  (VENTOLIN  HFA) 108 (90 Base) MCG/ACT inhaler, Inhale 2 puffs into the lungs every 6 (six) hours as needed for wheezing or shortness of breath., Disp: 8 g, Rfl: 2   amitriptyline  (ELAVIL ) 50 MG tablet, TAKE 1 TABLET BY MOUTH EVERYDAY AT BEDTIME, Disp: 90 tablet, Rfl: 0   aspirin 81 MG tablet, Take 81 mg by mouth at bedtime., Disp: , Rfl:    atorvastatin  (LIPITOR) 10 MG tablet, TAKE 1 TABLET BY MOUTH EVERY DAY, Disp: 90 tablet, Rfl: 0   Cholecalciferol (VITAMIN D3 PO), Take 1 tablet by mouth daily., Disp: , Rfl:    ibuprofen (ADVIL) 200 MG tablet, Take 200 mg by mouth every 6 (six) hours as needed., Disp: , Rfl:    saw palmetto 160 MG capsule, Take 160 mg by mouth 2 (two) times daily., Disp: , Rfl:    scopolamine  (TRANSDERM-SCOP) 1 MG/3DAYS, Place 1 patch (1.5 mg total) onto the skin every 3 (three) days., Disp: 10 patch, Rfl: 12   tirzepatide  (MOUNJARO ) 2.5 MG/0.5ML Pen, Inject 2.5 mg into the skin once a week., Disp: 2 mL, Rfl: 0   varenicline  (CHANTIX ) 1 MG tablet, TAKE 1 TABLET BY MOUTH TWICE A DAY, Disp: 168 tablet, Rfl: 2  amoxicillin -clavulanate (AUGMENTIN ) 875-125 MG tablet, Take 1 tablet by mouth 2 (two) times daily., Disp: 20 tablet, Rfl: 0   buPROPion  (WELLBUTRIN  XL) 150 MG 24 hr tablet, Take 1 tablet (150 mg total) by mouth daily., Disp: 90 tablet, Rfl: 1   metFORMIN  (GLUCOPHAGE ) 500 MG tablet, Take 1 tablet (500 mg total) by mouth 2 (two) times daily with a meal., Disp: 180 tablet, Rfl: 1   sildenafil  (VIAGRA ) 100 MG tablet, Take 0.5-1 tablets (50-100 mg total) by mouth daily as needed for erectile dysfunction., Disp: 5 tablet, Rfl: 11  Review of Systems:  Negative unless indicated in HPI.   Physical Exam: Vitals:   12/09/23 1300  BP: 130/80  Pulse: 85  Temp: 98.1 F (36.7 C)  TempSrc: Oral  SpO2: 96%  Weight: (!) 308 lb 12.8 oz (140.1 kg)  Height: 6' 0.5 (1.842 m)    Body mass index is 41.31 kg/m.   Physical Exam Vitals reviewed.  Constitutional:       General: He is not in acute distress.    Appearance: Normal appearance. He is obese. He is not ill-appearing, toxic-appearing or diaphoretic.  HENT:     Head: Normocephalic.     Right Ear: Tympanic membrane, ear canal and external ear normal. There is no impacted cerumen.     Left Ear: Tympanic membrane, ear canal and external ear normal. There is no impacted cerumen.     Nose: Nose normal.     Mouth/Throat:     Mouth: Mucous membranes are moist.     Pharynx: Oropharynx is clear. No oropharyngeal exudate or posterior oropharyngeal erythema.  Eyes:     General: No scleral icterus.       Right eye: No discharge.        Left eye: No discharge.     Conjunctiva/sclera: Conjunctivae normal.     Pupils: Pupils are equal, round, and reactive to light.  Neck:     Vascular: No carotid bruit.  Cardiovascular:     Rate and Rhythm: Normal rate and regular rhythm.     Pulses: Normal pulses.     Heart sounds: Normal heart sounds.  Pulmonary:     Effort: Pulmonary effort is normal. No respiratory distress.     Breath sounds: Normal breath sounds.  Abdominal:     General: Abdomen is flat. Bowel sounds are normal.     Palpations: Abdomen is soft.  Musculoskeletal:        General: Normal range of motion.     Cervical back: Normal range of motion.  Skin:    General: Skin is warm and dry.  Neurological:     General: No focal deficit present.     Mental Status: He is alert and oriented to person, place, and time. Mental status is at baseline.  Psychiatric:        Mood and Affect: Mood normal.        Behavior: Behavior normal.        Thought Content: Thought content normal.        Judgment: Judgment normal.      Impression and Plan:  Routine general medical examination at a health care facility  Type 2 diabetes mellitus with other specified complication, without long-term current use of insulin (HCC) -     metFORMIN  HCl; Take 1 tablet (500 mg total) by mouth 2 (two) times daily with a meal.   Dispense: 180 tablet; Refill: 1 -     Microalbumin / creatinine urine ratio -  Tirzepatide ; Inject 2.5 mg into the skin once a week.  Dispense: 2 mL; Refill: 0  Vitamin D  deficiency  IGT (impaired glucose tolerance)  BPH associated with nocturia  Tobacco abuse -     buPROPion  HCl ER (XL); Take 1 tablet (150 mg total) by mouth daily.  Dispense: 90 tablet; Refill: 1  ERECTILE DYSFUNCTION -     Sildenafil  Citrate; Take 0.5-1 tablets (50-100 mg total) by mouth daily as needed for erectile dysfunction.  Dispense: 5 tablet; Refill: 11  Morbid obesity (HCC)   -Recommend routine eye and dental care. -Healthy lifestyle discussed in detail. -Labs to be updated today. -Prostate cancer screening: PSA within normal limit Health Maintenance  Topic Date Due   Yearly kidney health urinalysis for diabetes  Never done   Hepatitis B Vaccine (1 of 3 - Risk 3-dose series) Never done   Screening for Lung Cancer  08/29/2024   Yearly kidney function blood test for diabetes  10/21/2024   Pneumococcal Vaccine for age over 6 (3 of 3 - PCV20 or PCV21) 01/12/2026   DTaP/Tdap/Td vaccine (3 - Td or Tdap) 05/14/2027   Colon Cancer Screening  10/08/2030   Flu Shot  Completed   COVID-19 Vaccine  Completed   Hepatitis C Screening  Completed   HIV Screening  Completed   Zoster (Shingles) Vaccine  Completed   HPV Vaccine  Aged Out   Meningitis B Vaccine  Aged Out     -Immunizations and cancer screenings are up-to-date. - Start Mounjaro  for diabetes. - Congratulated on smoking cessation.    Tully Theophilus Andrews, MD  Primary Care at [facility]

## 2023-12-10 ENCOUNTER — Other Ambulatory Visit: Payer: Self-pay | Admitting: Internal Medicine

## 2023-12-10 DIAGNOSIS — N521 Erectile dysfunction due to diseases classified elsewhere: Secondary | ICD-10-CM

## 2024-01-21 ENCOUNTER — Encounter: Payer: Self-pay | Admitting: Internal Medicine

## 2024-01-21 ENCOUNTER — Other Ambulatory Visit: Payer: Self-pay | Admitting: Internal Medicine

## 2024-01-21 DIAGNOSIS — E1169 Type 2 diabetes mellitus with other specified complication: Secondary | ICD-10-CM

## 2024-01-21 MED ORDER — MOUNJARO 5 MG/0.5ML ~~LOC~~ SOAJ: 5.0000 mg | SUBCUTANEOUS | 0 refills | Status: AC

## 2024-03-09 ENCOUNTER — Ambulatory Visit: Admitting: Internal Medicine
